# Patient Record
Sex: Female | Born: 1990
Health system: Southern US, Community
[De-identification: ages and names within clinical notes are randomized; demographics above are authoritative.]

## PROBLEM LIST (undated history)

## (undated) ENCOUNTER — Inpatient Hospital Stay (HOSPITAL_COMMUNITY): Payer: Self-pay

## (undated) DIAGNOSIS — R51 Headache: Secondary | ICD-10-CM

## (undated) DIAGNOSIS — R519 Headache, unspecified: Secondary | ICD-10-CM

## (undated) HISTORY — PX: NO PAST SURGERIES: SHX2092

## (undated) HISTORY — DX: Headache, unspecified: R51.9

## (undated) HISTORY — DX: Headache: R51

## (undated) HISTORY — PX: WISDOM TOOTH EXTRACTION: SHX21

---

## 2016-11-22 DIAGNOSIS — J3489 Other specified disorders of nose and nasal sinuses: Secondary | ICD-10-CM | POA: Diagnosis not present

## 2016-11-22 DIAGNOSIS — J029 Acute pharyngitis, unspecified: Secondary | ICD-10-CM | POA: Diagnosis not present

## 2016-11-22 DIAGNOSIS — R51 Headache: Secondary | ICD-10-CM | POA: Diagnosis not present

## 2016-11-22 DIAGNOSIS — J069 Acute upper respiratory infection, unspecified: Secondary | ICD-10-CM | POA: Diagnosis not present

## 2016-11-22 DIAGNOSIS — R590 Localized enlarged lymph nodes: Secondary | ICD-10-CM | POA: Diagnosis not present

## 2016-11-22 DIAGNOSIS — R05 Cough: Secondary | ICD-10-CM | POA: Diagnosis not present

## 2016-11-22 DIAGNOSIS — R0981 Nasal congestion: Secondary | ICD-10-CM | POA: Diagnosis not present

## 2016-11-22 DIAGNOSIS — R112 Nausea with vomiting, unspecified: Secondary | ICD-10-CM | POA: Diagnosis not present

## 2017-01-15 ENCOUNTER — Emergency Department (HOSPITAL_COMMUNITY)
Admission: EM | Admit: 2017-01-15 | Discharge: 2017-01-15 | Disposition: A | Discharge status: Home / Self Care | Payer: BLUE CROSS/BLUE SHIELD | Attending: Emergency Medicine | Admitting: Emergency Medicine

## 2017-01-15 ENCOUNTER — Encounter (HOSPITAL_COMMUNITY): Payer: Self-pay

## 2017-01-15 DIAGNOSIS — N898 Other specified noninflammatory disorders of vagina: Secondary | ICD-10-CM | POA: Diagnosis not present

## 2017-01-15 LAB — WET PREP, GENITAL
Sperm: NONE SEEN
Trich, Wet Prep: NONE SEEN
Yeast Wet Prep HPF POC: NONE SEEN

## 2017-01-15 LAB — RAPID HIV SCREEN (HIV 1/2 AB+AG)
HIV 1/2 Antibodies: NONREACTIVE
HIV-1 P24 Antigen - HIV24: NONREACTIVE

## 2017-01-15 LAB — POC URINE PREG, ED: PREG TEST UR: NEGATIVE

## 2017-01-15 MED ORDER — CEFTRIAXONE SODIUM 250 MG IJ SOLR
250.0000 mg | Freq: Once | INTRAMUSCULAR | Status: AC
  Administered 2017-01-15: 250 mg via INTRAMUSCULAR
  Filled 2017-01-15: qty 250

## 2017-01-15 MED ORDER — METRONIDAZOLE 500 MG PO TABS: 500.0000 mg | ORAL_TABLET | Freq: Two times a day (BID) | ORAL | 0 refills | Status: DC

## 2017-01-15 MED ORDER — AZITHROMYCIN 250 MG PO TABS
1000.0000 mg | ORAL_TABLET | Freq: Once | ORAL | Status: AC
  Administered 2017-01-15: 1000 mg via ORAL
  Filled 2017-01-15: qty 4

## 2017-01-15 MED ORDER — LIDOCAINE HCL (PF) 1 % IJ SOLN
INTRAMUSCULAR | Status: AC
  Administered 2017-01-15: 2 mL
  Filled 2017-01-15: qty 5

## 2017-01-15 NOTE — ED Triage Notes (Signed)
Pt reports abnormal vaginal bleeding and discharge onset 2 days ago. She is requesting to be tested for STDs.

## 2017-01-15 NOTE — ED Provider Notes (Signed)
[facility]EM[facility] DEPT Provider Note   CSN: [id number] Arrival date & time: 01/15/17  1328     History   Chief Complaint Chief Complaint  Patient presents with  . Vaginal Discharge    HPI [name] is a 26 y.o. female.  HPI   [age] year old female presents today with complaints of vaginal discharge.  Patient reports symptoms started approximately 2 days ago.  She notes this discharge is abnormal for her, also had very minor bleeding associated with this.  She reports very minor pain over the mid pelvis, no urinary symptoms.  Patient reports she is sexually active not always using protection.  She denies any fever, nausea vomiting upper abdominal pain or any systemic symptoms.  Patient requesting STD testing.  LMP 01/13/2017.    History reviewed. No pertinent past medical history.  There are no active problems to display for this patient.   History reviewed. No pertinent surgical history.  OB History    No data available       Home Medications    Prior to Admission medications   Medication Sig Start Date End Date Taking? Authorizing Provider  metroNIDAZOLE (FLAGYL) 500 MG tablet Take 1 tablet (500 mg total) by mouth 2 (two) times daily. 01/15/17   Eyvonne MechanicHedges, Catricia Scheerer, PA-C    Family History No family history on file.  Social History Social History  Substance Use Topics  . Smoking status: Never Smoker  . Smokeless tobacco: Never Used  . Alcohol use Yes     Allergies   Patient has no allergy information on record.   Review of Systems Review of Systems  All other systems reviewed and are negative.    Physical Exam Updated Vital Signs BP 113/65   Pulse 67   Temp 98.6 F (37 C) (Oral)   Resp 16   LMP 01/13/2017   SpO2 100%   Physical Exam  Constitutional: She is oriented to person, place, and time. She appears well-developed and well-nourished.  HENT:  Head: Normocephalic and atraumatic.  Eyes: Conjunctivae are normal. Pupils are equal, round, and  reactive to light. Right eye exhibits no discharge. Left eye exhibits no discharge. No scleral icterus.  Neck: Normal range of motion. No JVD present. No tracheal deviation present.  Pulmonary/Chest: Effort normal. No stridor.  Abdominal: Soft. She exhibits no distension and no mass. There is no tenderness. There is no rebound and no guarding. No hernia.  Genitourinary:  Genitourinary Comments: Purulent discharge, no bleeding, loss closed, no signs of trauma, no cervical motion tenderness, no adnexal tenderness  Neurological: She is alert and oriented to person, place, and time. Coordination normal.  Psychiatric: She has a normal mood and affect. Her behavior is normal. Judgment and thought content normal.  Nursing note and vitals reviewed.    ED Treatments / Results  Labs (all labs ordered are listed, but only abnormal results are displayed) Labs Reviewed  WET PREP, GENITAL - Abnormal; Notable for the following:       Result Value   Clue Cells Wet Prep HPF POC PRESENT (*)    WBC, Wet Prep HPF POC MANY (*)    All other components within normal limits  HIV ANTIBODY (ROUTINE TESTING)  RAPID HIV SCREEN (HIV 1/2 AB+AG)  POC URINE PREG, ED  GC/CHLAMYDIA PROBE AMP ([place]    EKG  EKG Interpretation None       Radiology No results found.  Procedures Procedures (including critical care time)  Medications Ordered in ED Medications  cefTRIAXone (ROCEPHIN) injection 250 mg (not administered)  lidocaine (PF) (XYLOCAINE) 1 % injection (not administered)  azithromycin (ZITHROMAX) tablet 1,000 mg (1,000 mg Oral Given 01/15/17 1711)     Initial Impression / Assessment and Plan / ED Course  I have reviewed the triage vital signs and the nursing notes.  Pertinent labs & imaging results that were available during my care of the patient were reviewed by me and considered in my medical decision making (see chart for details).      Final Clinical Impressions(s) / ED  Diagnoses   Final diagnoses:  Vaginal discharge    26 year old female presents today with vaginal discharge.  Her exam is significant for discharge consistent with STD.  Patient will be treated prophylactically for gonorrhea and chlamydia.  She also has clue cells on her wet prep.  She will be treated with metronidazole as an outpatient.  No signs of pelvic inflammatory disease or any significant infection.  She will be given return precautions, she verbalized understanding and agreement to today's plan had no further questions or concerns at time discharge.  Patient instructed to inform all sexual partners of any relevant positive testing, and to instruct him for testing and treatment.  New Prescriptions New Prescriptions   METRONIDAZOLE (FLAGYL) 500 MG TABLET    Take 1 tablet (500 mg total) by mouth 2 (two) times daily.     Eyvonne Mechanic, PA-C 01/15/17 1713    Mancel Bale, MD 01/16/17 [phone]

## 2017-01-15 NOTE — Discharge Instructions (Signed)
Please read attached information. If you experience any new or worsening signs or symptoms please return to the emergency room for evaluation. Please follow-up with your primary care provider or specialist as discussed. Please use medication prescribed only as directed and discontinue taking if you have any concerning signs or symptoms.   °

## 2017-01-16 LAB — HIV ANTIBODY (ROUTINE TESTING W REFLEX): HIV Screen 4th Generation wRfx: NONREACTIVE

## 2017-01-17 LAB — GC/CHLAMYDIA PROBE AMP (~~LOC~~) NOT AT ARMC
Chlamydia: POSITIVE — AB
Neisseria Gonorrhea: NEGATIVE

## 2017-04-17 ENCOUNTER — Emergency Department (HOSPITAL_COMMUNITY)
Admission: EM | Admit: 2017-04-17 | Discharge: 2017-04-17 | Disposition: A | Discharge status: Home / Self Care | Payer: BLUE CROSS/BLUE SHIELD | Attending: Emergency Medicine | Admitting: Emergency Medicine

## 2017-04-17 ENCOUNTER — Encounter (HOSPITAL_COMMUNITY): Payer: Self-pay | Admitting: Emergency Medicine

## 2017-04-17 DIAGNOSIS — R197 Diarrhea, unspecified: Secondary | ICD-10-CM | POA: Insufficient documentation

## 2017-04-17 DIAGNOSIS — E876 Hypokalemia: Secondary | ICD-10-CM | POA: Insufficient documentation

## 2017-04-17 DIAGNOSIS — R1013 Epigastric pain: Secondary | ICD-10-CM | POA: Insufficient documentation

## 2017-04-17 DIAGNOSIS — D72829 Elevated white blood cell count, unspecified: Secondary | ICD-10-CM | POA: Insufficient documentation

## 2017-04-17 DIAGNOSIS — R112 Nausea with vomiting, unspecified: Secondary | ICD-10-CM | POA: Insufficient documentation

## 2017-04-17 DIAGNOSIS — R1011 Right upper quadrant pain: Secondary | ICD-10-CM | POA: Diagnosis not present

## 2017-04-17 LAB — CBC
HEMATOCRIT: 36.3 % (ref 36.0–46.0)
HEMOGLOBIN: 11.6 g/dL — AB (ref 12.0–15.0)
MCH: 28.5 pg (ref 26.0–34.0)
MCHC: 32 g/dL (ref 30.0–36.0)
MCV: 89.2 fL (ref 78.0–100.0)
Platelets: 180 10*3/uL (ref 150–400)
RBC: 4.07 MIL/uL (ref 3.87–5.11)
RDW: 12.6 % (ref 11.5–15.5)
WBC: 13.3 10*3/uL — ABNORMAL HIGH (ref 4.0–10.5)

## 2017-04-17 LAB — URINALYSIS, ROUTINE W REFLEX MICROSCOPIC
BILIRUBIN URINE: NEGATIVE
GLUCOSE, UA: NEGATIVE mg/dL
Hgb urine dipstick: NEGATIVE
KETONES UR: NEGATIVE mg/dL
Leukocytes, UA: NEGATIVE
Nitrite: NEGATIVE
PH: 5 (ref 5.0–8.0)
Protein, ur: NEGATIVE mg/dL
SPECIFIC GRAVITY, URINE: 1.014 (ref 1.005–1.030)

## 2017-04-17 LAB — COMPREHENSIVE METABOLIC PANEL
ALBUMIN: 3.9 g/dL (ref 3.5–5.0)
ALT: 12 U/L — ABNORMAL LOW (ref 14–54)
AST: 19 U/L (ref 15–41)
Alkaline Phosphatase: 49 U/L (ref 38–126)
Anion gap: 9 (ref 5–15)
BILIRUBIN TOTAL: 1 mg/dL (ref 0.3–1.2)
BUN: 7 mg/dL (ref 6–20)
CHLORIDE: 103 mmol/L (ref 101–111)
CO2: 24 mmol/L (ref 22–32)
Calcium: 9 mg/dL (ref 8.9–10.3)
Creatinine, Ser: 0.89 mg/dL (ref 0.44–1.00)
GFR calc Af Amer: >60 mL/min (ref >60)
GFR calc non Af Amer: >60 mL/min (ref >60)
GLUCOSE: 115 mg/dL — AB (ref 65–99)
POTASSIUM: 3 mmol/L — AB (ref 3.5–5.1)
SODIUM: 136 mmol/L (ref 135–145)
Total Protein: 6.4 g/dL — ABNORMAL LOW (ref 6.5–8.1)

## 2017-04-17 LAB — PREGNANCY, URINE: Preg Test, Ur: NEGATIVE

## 2017-04-17 LAB — HCG, QUANTITATIVE, PREGNANCY: hCG, Beta Chain, Quant, S: <1 m[IU]/mL (ref <5)

## 2017-04-17 LAB — LIPASE, BLOOD: LIPASE: 33 U/L (ref 11–51)

## 2017-04-17 MED ORDER — ONDANSETRON 4 MG PO TBDP
4.0000 mg | ORAL_TABLET | Freq: Once | ORAL | Status: AC | PRN
  Administered 2017-04-17: 4 mg via ORAL

## 2017-04-17 MED ORDER — POTASSIUM CHLORIDE CRYS ER 20 MEQ PO TBCR: 20.0000 meq | EXTENDED_RELEASE_TABLET | Freq: Every day | ORAL | 0 refills | Status: DC

## 2017-04-17 MED ORDER — ONDANSETRON HCL 4 MG PO TABS: 4.0000 mg | ORAL_TABLET | Freq: Three times a day (TID) | ORAL | 0 refills | Status: DC | PRN

## 2017-04-17 MED ORDER — ONDANSETRON 4 MG PO TBDP
ORAL_TABLET | ORAL | Status: AC
  Filled 2017-04-17: qty 1

## 2017-04-17 NOTE — ED Provider Notes (Signed)
[facility] Provider Note   CSN: [id number] Arrival date & time: 04/17/17  0149     History   Chief Complaint Chief Complaint  Patient presents with  . Abdominal Pain  . Nausea    HPI   Blood pressure (!) 98/52, pulse 61, temperature 98.5 F (36.9 C), temperature source Oral, resp. rate 16, height  (1.702 m), weight 63 kg (139 lb), last menstrual period 03/28/2017, SpO2 99 %.  [name] is a 26 y.o. female complaining of Gastric discomfort followed by multiple episodes of emesis becoming blood-streaked and nonbloody, non-melanotic diarrhea onset approximately 12 hours ago. She denies fevers, chills, sick contacts, change in urination. She states that after the vomiting she had a severe right upper quadrant pain however this has resolved.   History reviewed. No pertinent past medical history.  There are no active problems to display for this patient.   History reviewed. No pertinent surgical history.  OB History    No data available       Home Medications    Prior to Admission medications   Medication Sig Start Date End Date Taking? Authorizing Provider  ondansetron (ZOFRAN) 4 MG tablet Take 1 tablet (4 mg total) by mouth every 8 (eight) hours as needed for nausea or vomiting. 04/17/17   Deeric Cruise, Joni Reining, PA-C  potassium chloride SA (K-DUR,KLOR-CON) 20 MEQ tablet Take 1 tablet (20 mEq total) by mouth daily. 04/17/17   Skyy Mcknight, Mardella Layman    Family History No family history on file.  Social History Social History  Substance Use Topics  . Smoking status: Never Smoker  . Smokeless tobacco: Never Used  . Alcohol use Yes     Allergies   Patient has no known allergies.   Review of Systems Review of Systems  A complete review of systems was obtained and all systems are negative except as noted in the HPI and PMH.    Physical Exam Updated Vital Signs BP (!) 98/52 (BP Location: Right Arm)   Pulse 61   Temp 98.5 F (36.9 C) (Oral)    Resp 16   Ht  (1.702 m)   Wt 63 kg (139 lb)   LMP 03/28/2017   SpO2 99%   BMI 21.77 kg/m   Physical Exam  Constitutional: She is oriented to person, place, and time. She appears well-developed and well-nourished. No distress.  HENT:  Head: Normocephalic and atraumatic.  Mouth/Throat: Oropharynx is clear and moist.  Eyes: Pupils are equal, round, and reactive to light. Conjunctivae and EOM are normal.  Neck: Normal range of motion.  Cardiovascular: Normal rate, regular rhythm and intact distal pulses.   Pulmonary/Chest: Effort normal and breath sounds normal.  Abdominal: Soft. She exhibits no distension and no mass. There is no tenderness. There is no rebound and no guarding. No hernia.  Musculoskeletal: Normal range of motion.  Neurological: She is alert and oriented to person, place, and time. Cranial nerve deficit: .npmdm.  Skin: Capillary refill takes less than 2 seconds. She is not diaphoretic.  Psychiatric: She has a normal mood and affect.  Nursing note and vitals reviewed.    ED Treatments / Results  Labs (all labs ordered are listed, but only abnormal results are displayed) Labs Reviewed  COMPREHENSIVE METABOLIC PANEL - Abnormal; Notable for the following:       Result Value   Potassium 3.0 (*)    Glucose, Bld 115 (*)    Total Protein 6.4 (*)    ALT 12 (*)  All other components within normal limits  CBC - Abnormal; Notable for the following:    WBC 13.3 (*)    Hemoglobin 11.6 (*)    All other components within normal limits  LIPASE, BLOOD  URINALYSIS, ROUTINE W REFLEX MICROSCOPIC  HCG, QUANTITATIVE, PREGNANCY  PREGNANCY, URINE    EKG  EKG Interpretation None       Radiology No results found.  Procedures Procedures (including critical care time)  Medications Ordered in ED Medications  ondansetron (ZOFRAN-ODT) disintegrating tablet 4 mg (4 mg Oral Given 04/17/17 0159)     Initial Impression / Assessment and Plan / ED Course  I have  reviewed the triage vital signs and the nursing notes.  Pertinent labs & imaging results that were available during my care of the patient were reviewed by me and considered in my medical decision making (see chart for details).     Vitals:   04/17/17 0155 04/17/17 0156 04/17/17 0605  BP: (!) 132/108  (!) 98/52  Pulse: 71  61  Resp: 18  16  Temp: 98.5 F (36.9 C)    TempSrc: Oral    SpO2: 100%  99%  Weight:  63 kg (139 lb)   Height:   (1.702 m)     Medications  ondansetron (ZOFRAN-ODT) disintegrating tablet 4 mg (4 mg Oral Given 04/17/17 0159)    [name] is 26 y.o. female presenting with Nausea vomiting diarrhea and the vomit turned blood-streaked. Initially after the emesis she had a severe right upper quadrant epigastric pain however this is resolved. Abdominal exam is benign, patient is afebrile and well-appearing, mild hypokalemia and leukocytosis. She is tolerating by mouth's. Likely a viral gastroenteritis. Patient will be given a prescription for Zofran and K Dur. Extensive discussion of return precautions patient verbalizes understanding and teach back technique.  Evaluation does not show pathology that would require ongoing emergent intervention or inpatient treatment. Pt is hemodynamically stable and mentating appropriately. Discussed findings and plan with patient/guardian, who agrees with care plan. All questions answered. Return precautions discussed and outpatient follow up given.      Final Clinical Impressions(s) / ED Diagnoses   Final diagnoses:  Nausea vomiting and diarrhea  Epigastric pain    New Prescriptions New Prescriptions   ONDANSETRON (ZOFRAN) 4 MG TABLET    Take 1 tablet (4 mg total) by mouth every 8 (eight) hours as needed for nausea or vomiting.   POTASSIUM CHLORIDE SA (K-DUR,KLOR-CON) 20 MEQ TABLET    Take 1 tablet (20 mEq total) by mouth daily.     [name], [name], Cordelia Poche 04/17/17 7829    Derwood Kaplan, MD 04/17/17 [phone]

## 2017-04-17 NOTE — ED Triage Notes (Signed)
Pt reports epigastric pain that now radiates into her RUQ present since yesterday. Also reports N/V/D. Pt threw up in triage. Zofran ODT given

## 2017-04-17 NOTE — Discharge Instructions (Signed)

## 2017-05-17 DIAGNOSIS — Z3401 Encounter for supervision of normal first pregnancy, first trimester: Secondary | ICD-10-CM | POA: Diagnosis not present

## 2017-05-17 DIAGNOSIS — Z3400 Encounter for supervision of normal first pregnancy, unspecified trimester: Secondary | ICD-10-CM | POA: Diagnosis not present

## 2017-05-17 LAB — OB RESULTS CONSOLE RPR: RPR: NONREACTIVE

## 2017-05-17 LAB — OB RESULTS CONSOLE ABO/RH: RH Type: POSITIVE

## 2017-05-17 LAB — OB RESULTS CONSOLE ANTIBODY SCREEN: ANTIBODY SCREEN: NEGATIVE

## 2017-05-17 LAB — OB RESULTS CONSOLE GC/CHLAMYDIA
Chlamydia: NEGATIVE
Gonorrhea: NEGATIVE

## 2017-05-17 LAB — OB RESULTS CONSOLE HIV ANTIBODY (ROUTINE TESTING): HIV: NONREACTIVE

## 2017-05-17 LAB — OB RESULTS CONSOLE RUBELLA ANTIBODY, IGM: RUBELLA: IMMUNE

## 2017-05-17 LAB — OB RESULTS CONSOLE HEPATITIS B SURFACE ANTIGEN: Hepatitis B Surface Ag: NEGATIVE

## 2017-05-31 ENCOUNTER — Other Ambulatory Visit: Payer: Self-pay | Admitting: Obstetrics and Gynecology

## 2017-05-31 ENCOUNTER — Other Ambulatory Visit (HOSPITAL_COMMUNITY)
Admission: RE | Admit: 2017-05-31 | Discharge: 2017-05-31 | Disposition: A | Discharge status: Home / Self Care | Payer: BLUE CROSS/BLUE SHIELD | Source: Ambulatory Visit | Attending: Obstetrics and Gynecology | Admitting: Obstetrics and Gynecology

## 2017-05-31 DIAGNOSIS — Z124 Encounter for screening for malignant neoplasm of cervix: Secondary | ICD-10-CM | POA: Insufficient documentation

## 2017-05-31 DIAGNOSIS — Z3401 Encounter for supervision of normal first pregnancy, first trimester: Secondary | ICD-10-CM | POA: Diagnosis not present

## 2017-06-03 LAB — CYTOLOGY - PAP
CHLAMYDIA, DNA PROBE: NEGATIVE
Diagnosis: NEGATIVE
NEISSERIA GONORRHEA: NEGATIVE

## 2017-07-01 ENCOUNTER — Inpatient Hospital Stay (HOSPITAL_COMMUNITY)
Admission: AD | Admit: 2017-07-01 | Discharge: 2017-07-02 | Disposition: A | Discharge status: Home / Self Care | Payer: BLUE CROSS/BLUE SHIELD | Source: Ambulatory Visit | Attending: Obstetrics and Gynecology | Admitting: Obstetrics and Gynecology

## 2017-07-01 ENCOUNTER — Encounter (HOSPITAL_COMMUNITY): Payer: Self-pay | Admitting: *Deleted

## 2017-07-01 DIAGNOSIS — Z3A12 12 weeks gestation of pregnancy: Secondary | ICD-10-CM | POA: Diagnosis not present

## 2017-07-01 DIAGNOSIS — Z3A14 14 weeks gestation of pregnancy: Secondary | ICD-10-CM | POA: Diagnosis not present

## 2017-07-01 DIAGNOSIS — O26892 Other specified pregnancy related conditions, second trimester: Secondary | ICD-10-CM

## 2017-07-01 DIAGNOSIS — R109 Unspecified abdominal pain: Secondary | ICD-10-CM | POA: Insufficient documentation

## 2017-07-01 LAB — URINALYSIS, ROUTINE W REFLEX MICROSCOPIC
Bilirubin Urine: NEGATIVE
Glucose, UA: NEGATIVE mg/dL
Hgb urine dipstick: NEGATIVE
Ketones, ur: NEGATIVE mg/dL
Leukocytes, UA: NEGATIVE
Nitrite: NEGATIVE
Protein, ur: NEGATIVE mg/dL
Specific Gravity, Urine: 1.01 (ref 1.005–1.030)
pH: 6.5 (ref 5.0–8.0)

## 2017-07-01 NOTE — MAU Note (Signed)
Chief Complaint: Abdominal Pain   None    SUBJECTIVE HPI: [name] is a 26 y.o. G1P0 at Unknown who presents to Maternity Admissions reporting abdominal pain.  It started today.  Denies bleeding or discharge.  Denies urinary frequency or urgency.  Denies any hx of STDs.  Prenatal hx unremarkable.  Last intercourse yesterday.  Location: lower abdomen Quality: mild cramping Severity: /10 on pain scale Duration: today Modifying factors: Has taken no otc Associated signs and symptoms: none  Past Medical History:  Diagnosis Date  . Medical history non-contributory    OB History  Gravida Para Term Preterm AB Living  1            SAB TAB Ectopic Multiple Live Births               # Outcome Date GA Lbr Len/2nd Weight Sex Delivery Anes PTL Lv  1 Current              Past Surgical History:  Procedure Laterality Date  . WISDOM TOOTH EXTRACTION     Social History   Socioeconomic History  . Marital status: Single    Spouse name: Not on file  . Number of children: Not on file  . Years of education: Not on file  . Highest education level: Not on file  Social Needs  . Financial resource strain: Not on file  . Food insecurity - worry: Not on file  . Food insecurity - inability: Not on file  . Transportation needs - medical: Not on file  . Transportation needs - non-medical: Not on file  Occupational History  . Not on file  Tobacco Use  . Smoking status: Never Smoker  . Smokeless tobacco: Never Used  Substance and Sexual Activity  . Alcohol use: No    Frequency: Never  . Drug use: Yes    Types: Marijuana    Comment: last use 3 months ago  . Sexual activity: Yes    Birth control/protection: None    Comment: last sex Jun 30 2017  Other Topics Concern  . Not on file  Social History Narrative  . Not on file   Family History  Problem Relation Age of Onset  . Hypertension Father   . Diabetes Maternal Grandmother    No current facility-administered medications on  file prior to encounter.    Current Outpatient Medications on File Prior to Encounter  Medication Sig Dispense Refill  . ondansetron (ZOFRAN) 4 MG tablet Take 1 tablet (4 mg total) by mouth every 8 (eight) hours as needed for nausea or vomiting. 10 tablet 0  . potassium chloride SA (K-DUR,KLOR-CON) 20 MEQ tablet Take 1 tablet (20 mEq total) by mouth daily. 3 tablet 0   No Known Allergies  I have reviewed patient's Past Medical Hx, Surgical Hx, Family Hx, Social Hx, medications and allergies.   Review of Systems  All other systems reviewed and are negative.   OBJECTIVE  Constitutional: Well-developed, well-nourished female in no acute distress.  Cardiovascular: normal rate Respiratory: normal rate and effort.  GI: Abd soft, non-tender, gravid appropriate for gestational age. Pos BS x 4 MS: Extremities nontender, no edema, normal ROM Neurologic: Alert and oriented x 4.  GU: Neg CVAT.  SPECULUM EXAM: NEFG, physiologic discharge, no blood noted, cervix clean  BIMANUAL: cervix closed; uterus 12 week size, no adnexal tenderness or masses.  No CMT.  LAB RESULTS Results for orders placed or performed during the hospital encounter of 07/01/17 (from the past 24  hour(s))  Urinalysis, Routine w reflex microscopic     Status: None   Collection Time: 07/01/17 10:00 PM  Result Value Ref Range   Color, Urine YELLOW YELLOW   APPearance CLEAR CLEAR   Specific Gravity, Urine 1.010 1.005 - 1.030   pH 6.5 5.0 - 8.0   Glucose, UA NEGATIVE NEGATIVE mg/dL   Hgb urine dipstick NEGATIVE NEGATIVE   Bilirubin Urine NEGATIVE NEGATIVE   Ketones, ur NEGATIVE NEGATIVE mg/dL   Protein, ur NEGATIVE NEGATIVE mg/dL   Nitrite NEGATIVE NEGATIVE   Leukocytes, UA NEGATIVE NEGATIVE   Wet mount negative clue, negative hyphae, neg trich IMAGING No results found.  MAU COURSE Orders Placed This Encounter  Procedures  . Urinalysis, Routine w reflex microscopic  . Discharge activity:  No Restrictions  . No  sexual activity restrictions  . Discharge patient Discharge disposition: 01-Home or Self Care; Discharge patient date: 07/01/2017   No orders of the defined types were placed in this encounter.   MDM PE and pelvic, ua ASSESSMENT 1. Abdominal pain in pregnancy, second trimester     PLAN Discharge home in stable condition.  Discussed normal discomforts of pregnancy.  Pt desires note for work for [employer]. bleeding precautions Follow-up Information    Gynecology, [facility] up in 2 week(s).   Specialty:  Obstetrics and Gynecology Contact information: [address] [place] [address] [phone]          Allergies as of 07/01/2017   No Known Allergies     Medication List    STOP taking these medications   ondansetron 4 MG tablet Commonly known as:  ZOFRAN   potassium chloride SA 20 MEQ tablet Commonly known as:  K-DUR,KLOR-CON        [name], [name] Jean, CNM 07/01/2017  11:28 PM

## 2017-07-01 NOTE — Discharge Instructions (Signed)

## 2017-07-16 NOTE — L&D Delivery Note (Addendum)
Delivery Note At 4:49 PM a viable female was delivered via Vaginal, Spontaneous (Presentation:left occiput anterior ;  ).  APGAR: 8, 9; weight pending  .   Placenta status: complete 3 vessel   Cord:  with the following complications:  Uterine atony.. Controlled with pitocin and 1000 mcg of cytotec Per rectum .  Cord pH: NA  Anesthesia:  epidrual  Episiotomy: None Lacerations: Periurethral hemostatic  Suture Repair: None Est. Blood Loss (mL):  400mL  Mom to postpartum.. Mom to receive magnesium sulfate 24 hour postpartum due to preeclampsia with severe features .  Baby to Couplet care / Skin to Skin.  Ashkan Chamberland J. 12/30/2017, 5:11 PM

## 2017-08-08 DIAGNOSIS — Z36 Encounter for antenatal screening for chromosomal anomalies: Secondary | ICD-10-CM | POA: Diagnosis not present

## 2017-08-08 DIAGNOSIS — Z3402 Encounter for supervision of normal first pregnancy, second trimester: Secondary | ICD-10-CM | POA: Diagnosis not present

## 2017-08-08 DIAGNOSIS — Z3A19 19 weeks gestation of pregnancy: Secondary | ICD-10-CM | POA: Diagnosis not present

## 2017-08-15 DIAGNOSIS — O283 Abnormal ultrasonic finding on antenatal screening of mother: Secondary | ICD-10-CM | POA: Diagnosis not present

## 2017-09-05 DIAGNOSIS — O350XX0 Maternal care for (suspected) central nervous system malformation in fetus, not applicable or unspecified: Secondary | ICD-10-CM | POA: Diagnosis not present

## 2017-09-05 DIAGNOSIS — Z3A23 23 weeks gestation of pregnancy: Secondary | ICD-10-CM | POA: Diagnosis not present

## 2017-10-04 DIAGNOSIS — Z3402 Encounter for supervision of normal first pregnancy, second trimester: Secondary | ICD-10-CM | POA: Diagnosis not present

## 2017-11-21 DIAGNOSIS — Z23 Encounter for immunization: Secondary | ICD-10-CM | POA: Diagnosis not present

## 2017-11-29 DIAGNOSIS — Z3403 Encounter for supervision of normal first pregnancy, third trimester: Secondary | ICD-10-CM | POA: Diagnosis not present

## 2017-11-29 LAB — OB RESULTS CONSOLE GBS: GBS: NEGATIVE

## 2017-12-23 ENCOUNTER — Telehealth (HOSPITAL_COMMUNITY): Payer: Self-pay | Admitting: *Deleted

## 2017-12-23 ENCOUNTER — Encounter (HOSPITAL_COMMUNITY): Payer: Self-pay | Admitting: *Deleted

## 2017-12-23 NOTE — Telephone Encounter (Signed)
Preadmission screen  

## 2017-12-25 DIAGNOSIS — O26843 Uterine size-date discrepancy, third trimester: Secondary | ICD-10-CM | POA: Diagnosis not present

## 2017-12-29 ENCOUNTER — Inpatient Hospital Stay (HOSPITAL_COMMUNITY)
Admission: AD | Admit: 2017-12-29 | Discharge: 2018-01-01 | DRG: 806 | Disposition: A | Discharge status: Home / Self Care | Payer: BLUE CROSS/BLUE SHIELD | Attending: Obstetrics and Gynecology | Admitting: Obstetrics and Gynecology

## 2017-12-29 ENCOUNTER — Inpatient Hospital Stay (HOSPITAL_COMMUNITY): Payer: BLUE CROSS/BLUE SHIELD | Admitting: Anesthesiology

## 2017-12-29 ENCOUNTER — Other Ambulatory Visit: Payer: Self-pay

## 2017-12-29 ENCOUNTER — Inpatient Hospital Stay (HOSPITAL_COMMUNITY)
Admission: RE | Admit: 2017-12-29 | Discharge: 2017-12-29 | Disposition: A | Discharge status: Home / Self Care | Payer: BLUE CROSS/BLUE SHIELD | Source: Ambulatory Visit | Attending: Obstetrics and Gynecology | Admitting: Obstetrics and Gynecology

## 2017-12-29 ENCOUNTER — Encounter (HOSPITAL_COMMUNITY): Payer: Self-pay

## 2017-12-29 DIAGNOSIS — Z8042 Family history of malignant neoplasm of prostate: Secondary | ICD-10-CM | POA: Diagnosis not present

## 2017-12-29 DIAGNOSIS — Z83511 Family history of glaucoma: Secondary | ICD-10-CM | POA: Diagnosis not present

## 2017-12-29 DIAGNOSIS — O2603 Excessive weight gain in pregnancy, third trimester: Secondary | ICD-10-CM | POA: Diagnosis not present

## 2017-12-29 DIAGNOSIS — O480 Post-term pregnancy: Secondary | ICD-10-CM | POA: Diagnosis not present

## 2017-12-29 DIAGNOSIS — Z833 Family history of diabetes mellitus: Secondary | ICD-10-CM

## 2017-12-29 DIAGNOSIS — Z3403 Encounter for supervision of normal first pregnancy, third trimester: Secondary | ICD-10-CM

## 2017-12-29 DIAGNOSIS — Z23 Encounter for immunization: Secondary | ICD-10-CM | POA: Diagnosis not present

## 2017-12-29 DIAGNOSIS — Z810 Family history of intellectual disabilities: Secondary | ICD-10-CM | POA: Diagnosis not present

## 2017-12-29 DIAGNOSIS — O1414 Severe pre-eclampsia complicating childbirth: Principal | ICD-10-CM | POA: Diagnosis not present

## 2017-12-29 DIAGNOSIS — Z3A40 40 weeks gestation of pregnancy: Secondary | ICD-10-CM

## 2017-12-29 DIAGNOSIS — E876 Hypokalemia: Secondary | ICD-10-CM | POA: Diagnosis not present

## 2017-12-29 DIAGNOSIS — Z8249 Family history of ischemic heart disease and other diseases of the circulatory system: Secondary | ICD-10-CM | POA: Diagnosis not present

## 2017-12-29 LAB — CBC
HCT: 32.4 % — ABNORMAL LOW (ref 36.0–46.0)
Hemoglobin: 10.4 g/dL — ABNORMAL LOW (ref 12.0–15.0)
MCH: 27.8 pg (ref 26.0–34.0)
MCHC: 32.1 g/dL (ref 30.0–36.0)
MCV: 86.6 fL (ref 78.0–100.0)
PLATELETS: 196 10*3/uL (ref 150–400)
RBC: 3.74 MIL/uL — ABNORMAL LOW (ref 3.87–5.11)
RDW: 14.9 % (ref 11.5–15.5)
WBC: 8.8 10*3/uL (ref 4.0–10.5)

## 2017-12-29 LAB — TYPE AND SCREEN
ABO/RH(D): O POS
Antibody Screen: NEGATIVE

## 2017-12-29 LAB — RPR: RPR Ser Ql: NONREACTIVE

## 2017-12-29 LAB — ABO/RH: ABO/RH(D): O POS

## 2017-12-29 MED ORDER — SOD CITRATE-CITRIC ACID 500-334 MG/5ML PO SOLN: 30.0000 mL | ORAL | Status: DC | PRN

## 2017-12-29 MED ORDER — PHENYLEPHRINE 40 MCG/ML (10ML) SYRINGE FOR IV PUSH (FOR BLOOD PRESSURE SUPPORT): 80.0000 ug | PREFILLED_SYRINGE | INTRAVENOUS | Status: DC | PRN

## 2017-12-29 MED ORDER — LIDOCAINE HCL (PF) 1 % IJ SOLN
INTRAMUSCULAR | Status: DC | PRN
  Administered 2017-12-29 (×2): 5 mL via EPIDURAL

## 2017-12-29 MED ORDER — ACETAMINOPHEN 325 MG PO TABS
650.0000 mg | ORAL_TABLET | ORAL | Status: DC | PRN
  Administered 2017-12-30: 650 mg via ORAL
  Filled 2017-12-29: qty 2

## 2017-12-29 MED ORDER — FENTANYL 2.5 MCG/ML BUPIVACAINE 1/10 % EPIDURAL INFUSION (WH - ANES)
14.0000 mL/h | INTRAMUSCULAR | Status: DC | PRN
  Administered 2017-12-29 – 2017-12-30 (×4): 14 mL/h via EPIDURAL
  Filled 2017-12-29 (×4): qty 100

## 2017-12-29 MED ORDER — PHENYLEPHRINE 40 MCG/ML (10ML) SYRINGE FOR IV PUSH (FOR BLOOD PRESSURE SUPPORT)
80.0000 ug | PREFILLED_SYRINGE | INTRAVENOUS | Status: DC | PRN
  Filled 2017-12-29: qty 5

## 2017-12-29 MED ORDER — ZOLPIDEM TARTRATE 5 MG PO TABS: 5.0000 mg | ORAL_TABLET | Freq: Every evening | ORAL | Status: DC | PRN

## 2017-12-29 MED ORDER — EPHEDRINE 5 MG/ML INJ: 10.0000 mg | INTRAVENOUS | Status: DC | PRN

## 2017-12-29 MED ORDER — LACTATED RINGERS IV SOLN
INTRAVENOUS | Status: DC
  Administered 2017-12-29 (×2): via INTRAVENOUS
  Administered 2017-12-29: 125 mL/h via INTRAVENOUS
  Administered 2017-12-29 – 2017-12-30 (×2): via INTRAVENOUS

## 2017-12-29 MED ORDER — EPHEDRINE 5 MG/ML INJ
10.0000 mg | INTRAVENOUS | Status: DC | PRN
  Filled 2017-12-29: qty 2

## 2017-12-29 MED ORDER — PHENYLEPHRINE 40 MCG/ML (10ML) SYRINGE FOR IV PUSH (FOR BLOOD PRESSURE SUPPORT)
80.0000 ug | PREFILLED_SYRINGE | INTRAVENOUS | Status: DC | PRN
  Filled 2017-12-29: qty 10
  Filled 2017-12-29: qty 5
  Filled 2017-12-29: qty 10

## 2017-12-29 MED ORDER — OXYCODONE-ACETAMINOPHEN 5-325 MG PO TABS: 1.0000 | ORAL_TABLET | ORAL | Status: DC | PRN

## 2017-12-29 MED ORDER — LIDOCAINE HCL (PF) 1 % IJ SOLN
30.0000 mL | INTRAMUSCULAR | Status: DC | PRN
  Filled 2017-12-29: qty 30

## 2017-12-29 MED ORDER — HYDROXYZINE HCL 50 MG PO TABS
50.0000 mg | ORAL_TABLET | Freq: Four times a day (QID) | ORAL | Status: DC | PRN
  Filled 2017-12-29: qty 1

## 2017-12-29 MED ORDER — TERBUTALINE SULFATE 1 MG/ML IJ SOLN: 0.2500 mg | Freq: Once | INTRAMUSCULAR | Status: DC | PRN

## 2017-12-29 MED ORDER — LACTATED RINGERS IV SOLN
500.0000 mL | Freq: Once | INTRAVENOUS | Status: AC
  Administered 2017-12-29: 500 mL via INTRAVENOUS

## 2017-12-29 MED ORDER — OXYCODONE-ACETAMINOPHEN 5-325 MG PO TABS: 2.0000 | ORAL_TABLET | ORAL | Status: DC | PRN

## 2017-12-29 MED ORDER — OXYTOCIN 40 UNITS IN LACTATED RINGERS INFUSION - SIMPLE MED
1.0000 m[IU]/min | INTRAVENOUS | Status: DC
  Administered 2017-12-29: 1 m[IU]/min via INTRAVENOUS
  Administered 2017-12-30: 5 m[IU]/min via INTRAVENOUS

## 2017-12-29 MED ORDER — MISOPROSTOL 25 MCG QUARTER TABLET
25.0000 ug | ORAL_TABLET | ORAL | Status: DC | PRN
  Administered 2017-12-29 (×2): 25 ug via VAGINAL
  Filled 2017-12-29 (×4): qty 1

## 2017-12-29 MED ORDER — ONDANSETRON HCL 4 MG/2ML IJ SOLN
4.0000 mg | Freq: Four times a day (QID) | INTRAMUSCULAR | Status: DC | PRN
  Filled 2017-12-29: qty 2

## 2017-12-29 MED ORDER — OXYTOCIN 40 UNITS IN LACTATED RINGERS INFUSION - SIMPLE MED
2.5000 [IU]/h | INTRAVENOUS | Status: DC
  Administered 2017-12-30: 2.5 [IU]/h via INTRAVENOUS
  Filled 2017-12-29 (×2): qty 1000

## 2017-12-29 MED ORDER — LACTATED RINGERS IV SOLN: 500.0000 mL | Freq: Once | INTRAVENOUS | Status: DC

## 2017-12-29 MED ORDER — FLEET ENEMA 7-19 GM/118ML RE ENEM
1.0000 | ENEMA | RECTAL | Status: DC | PRN
  Administered 2017-12-30: 1 via RECTAL
  Filled 2017-12-29: qty 1

## 2017-12-29 MED ORDER — OXYTOCIN BOLUS FROM INFUSION
500.0000 mL | Freq: Once | INTRAVENOUS | Status: AC
  Administered 2017-12-30: 500 mL via INTRAVENOUS

## 2017-12-29 MED ORDER — DIPHENHYDRAMINE HCL 50 MG/ML IJ SOLN: 12.5000 mg | INTRAMUSCULAR | Status: DC | PRN

## 2017-12-29 MED ORDER — FENTANYL CITRATE (PF) 100 MCG/2ML IJ SOLN
50.0000 ug | INTRAMUSCULAR | Status: DC | PRN
  Administered 2017-12-29 (×3): 100 ug via INTRAVENOUS
  Filled 2017-12-29 (×3): qty 2

## 2017-12-29 MED ORDER — LACTATED RINGERS IV SOLN
500.0000 mL | INTRAVENOUS | Status: DC | PRN
  Administered 2017-12-29: 300 mL via INTRAVENOUS
  Administered 2017-12-30: 1000 mL via INTRAVENOUS
  Administered 2017-12-30: 500 mL via INTRAVENOUS
  Administered 2017-12-30: 250 mL via INTRAVENOUS
  Administered 2017-12-30: 1000 mL via INTRAVENOUS

## 2017-12-29 NOTE — Anesthesia Pain Management Evaluation Note (Signed)
  [facility] Pain Management Visit Note  Patient: [name], 27 y.o., female  "Hello I am a [facility] of the anesthesia [facility]. We have an anesthesia team available at all times to provide care throughout the hospital, including epidural management and anesthesia for C-section. I don't know your plan for the delivery whether it a natural birth, water birth, IV sedation, nitrous supplementation, doula or epidural, but we want to meet your pain goals."   1.Was your pain managed to your expectations on prior hospitalizations?   No prior hospitalizations  2.What is your expectation for pain management during this hospitalization?     Epidural  3.How can we help you reach that goal? unsure  Record the patient's initial score and the patient's pain goal.   Pain: 5  Pain Goal: 8 [facility] wants you to be able to say your pain was always managed very well.  Cephus ShellingBURGER,[name] 12/29/2017

## 2017-12-29 NOTE — Progress Notes (Addendum)
[name] is a 27 y.o. G1P0 at 7721w2d   Subjective: Pt with c/o significant pain since SROM.  Pt has declined epidural b/c "I don't want to get it too soon."  IV pain medication has helped some.  Objective: BP (!) 143/87   Pulse (!) 101   Temp 98.1 F (36.7 C) (Oral)   Resp 20   Ht 5' 7.1" (1.704 m)   Wt 86.4 kg (190 lb 6.4 oz)   LMP 03/22/2017   BMI 29.73 kg/m  No intake/output data recorded. No intake/output data recorded.  Pt moaning with contractions. Abd:  Contractions moderate FHT:  FHR: 130s bpm, variability: moderate,  accelerations:  Present,  decelerations:  Absent and   UC:   regular, every 2 minutes SVE:   Dilation: 2.5 Effacement (%): 80 Station: -1 Exam by:: Dion BodyVarnado, MD Tight 3 cm.    Labs: Lab Results  Component Value Date   WBC 8.8 12/29/2017   HGB 10.4 (L) 12/29/2017   HCT 32.4 (L) 12/29/2017   MCV 86.6 12/29/2017   PLT 196 12/29/2017    Assessment / Plan: IUP @ 40 2/7 weeks  Labor: Progressing normally on Pitocin.  Preeclampsia:  BP Normal Fetal Wellbeing:  Category I Pain Control:  Agreeable to epidural now. I/D:  SROM x 4+ hours Anticipated MOD:  NSVD  [name] 12/29/2017, 9:29 PM

## 2017-12-29 NOTE — Anesthesia Preprocedure Evaluation (Addendum)
Anesthesia Evaluation  Patient identified by MRN, date of birth, ID band Patient awake    Reviewed: Allergy & Precautions, NPO status , Patient's Chart, lab work & pertinent test results  History of Anesthesia Complications Negative for: history of anesthetic complications  Airway Mallampati: III  TM Distance: >3 FB Neck ROM: Full    Dental  (+) Dental Advisory Given   Pulmonary neg pulmonary ROS,    breath sounds clear to auscultation       Cardiovascular negative cardio ROS   Rhythm:Regular Rate:Normal     Neuro/Psych  Headaches,    GI/Hepatic Neg liver ROS, GERD  ,  Endo/Other  negative endocrine ROS  Renal/GU negative Renal ROS     Musculoskeletal   Abdominal   Peds  Hematology Hb 10.4, plt 196k   Anesthesia Other Findings   Reproductive/Obstetrics (+) Pregnancy                            Anesthesia Physical Anesthesia Plan  ASA: II  Anesthesia Plan: Epidural   Post-op Pain Management:    Induction:   PONV Risk Score and Plan: 2 and Treatment may vary due to age or medical condition  Airway Management Planned: Natural Airway  Additional Equipment:   Intra-op Plan:   Post-operative Plan:   Informed Consent: I have reviewed the patients History and Physical, chart, labs and discussed the procedure including the risks, benefits and alternatives for the proposed anesthesia with the patient or authorized representative who has indicated his/her understanding and acceptance.   Dental advisory given  Plan Discussed with:   Anesthesia Plan Comments: (Patient identified. Risks/Benefits/Options discussed with patient including but not limited to bleeding, infection, nerve damage, paralysis, failed block, incomplete pain control, headache, blood pressure changes, nausea, vomiting, reactions to medication both or allergic, itching and postpartum back pain. Confirmed with bedside  nurse the patient's most recent platelet count. Confirmed with patient that they are not currently taking any anticoagulation, have any bleeding history or any family history of bleeding disorders. Patient expressed understanding and wished to proceed. All questions were answered. )       Anesthesia Quick Evaluation

## 2017-12-29 NOTE — Progress Notes (Addendum)
Pt feeling some cramping. RN unable to feel presenting part.  BP (!) 144/73 (BP Location: Right Arm)   Pulse 91   Temp 98.9 F (37.2 C) (Oral)   Resp 18   Ht 5' 7.1" (1.704 m)   Wt 86.4 kg (190 lb 6.4 oz)   LMP 03/22/2017   BMI 29.73 kg/m   Gen:  NAD Cervix: soft, posterior, FT, Vertex Vertex by ultrasound.  Cat I tracing Couplets, triplets every 3 minutes  A/P IUP @ 40 2/7 weeks IOL with Cytotec.  If contractions do not space out, start low dose Pitocin.  Place Foley bulb when dilated. Fentanyl,Nitrous oxide prn.

## 2017-12-29 NOTE — H&P (Signed)
[name] is a 27 y.o. female @ 40 2/7 weeks presenting for an elective IOL. Pt without complaints.  Denies contractions. PNC with [facility](Shemia Bevel) has been complicated by:  Bilateral choroid plexus cysts.  Normal Cell free DNA 50+ pound weight gain.  OB History    Gravida  1   Para      Term      Preterm      AB      Living        SAB      TAB      Ectopic      Multiple      Live Births             Past Medical History:  Diagnosis Date  . Headache    Past Surgical History:  Procedure Laterality Date  . NO PAST SURGERIES    . WISDOM TOOTH EXTRACTION     Family History: family history includes Depression in her maternal grandmother and mother; Diabetes in her father, maternal grandmother, and mother; Glaucoma in her father; Hypertension in her father; Mental retardation in her maternal uncle; Prostate cancer in her father. Social History:  reports that she has never smoked. She has never used smokeless tobacco. She reports that she has current or past drug history. Drug: Marijuana. She reports that she does not drink alcohol.     Maternal Diabetes: No Genetic Screening: Normal Maternal Ultrasounds/Referrals: Abnormal:  Findings:   Other:Bilateral choroid plexus cysts Fetal Ultrasounds or other Referrals:  None Maternal Substance Abuse:  No Significant Maternal Medications:  None Significant Maternal Lab Results:  Lab values include: Group B Strep negative Other Comments:  None  Review of Systems  Gastrointestinal: Negative for abdominal pain.   Maternal Medical History:  Contractions: Perceived severity is mild.    Fetal activity: Perceived fetal activity is normal.    Prenatal Complications - Diabetes: none.    Dilation: Fingertip Effacement (%): Thick Station: -3 Exam by:: C Fisher RN Blood pressure 132/76, pulse 93, temperature 97.9 F (36.6 C), temperature source Oral, resp. rate 16, height 5' 7.1" (1.704 m), weight 86.4 kg (190  lb 6.4 oz), last menstrual period 03/22/2017. Maternal Exam:  Uterine Assessment: Contraction frequency is irregular.   Abdomen: Estimated fetal weight is Ultrasound 1 week ago 8 lb 8 oz..   Fetal presentation: vertex  Introitus: Normal vulva. Pelvis: adequate for delivery.   Cervix: not evaluated.   Fetal Exam Fetal Monitor Review: Variability: moderate (6-25 bpm).   Pattern: accelerations present.    Fetal State Assessment: Category I - tracings are normal.     Physical Exam  Constitutional: She is oriented to person, place, and time. She appears well-developed and well-nourished.  HENT:  Head: Normocephalic and atraumatic.  Eyes: EOM are normal.  Neck: Normal range of motion.  Respiratory: Effort normal. No respiratory distress.  Musculoskeletal: Normal range of motion. She exhibits edema. She exhibits no tenderness.  Neurological: She is alert and oriented to person, place, and time.  Skin: Skin is warm and dry.  Psychiatric: She has a normal mood and affect.    Prenatal labs: ABO, Rh: --/--/O POS Performed at [facility], [address] [place], [place] [address]  [phone](06/16 [id number]) Antibody: NEG (06/16 0055) Rubella: Immune (11/02 0000) RPR: Nonreactive (11/02 0000)  HBsAg: Negative (11/02 0000)  HIV: Non-reactive (11/02 0000)  GBS: Negative (05/17 0000)   Assessment/Plan: IUP @ 40 2/7 weeks IOL.  S/p Cytotec x 2.  Plan to  place Foley bulb when at least 1 cm. Cat I tracing. Excessive maternal weight gain.  Borderline macrosomia. Anticipate SVD.  Geryl Rankins 12/29/2017, 7:49 AM

## 2017-12-29 NOTE — Anesthesia Procedure Notes (Signed)
Epidural Patient location during procedure: OB Start time: 12/29/2017 9:37 PM End time: 12/29/2017 10:04 PM  Staffing Anesthesiologist: Jairo BenJackson, Sherrill Mckamie, MD Performed: anesthesiologist   Preanesthetic Checklist Completed: patient identified, surgical consent, pre-op evaluation, timeout performed, IV checked, risks and benefits discussed and monitors and equipment checked  Epidural Patient position: sitting Prep: site prepped and draped and DuraPrep Patient monitoring: blood pressure, continuous pulse ox and heart rate Approach: midline Location: L3-L4 Injection technique: LOR air  Needle:  Needle type: Tuohy  Needle gauge: 17 G Needle length: 9 cm Needle insertion depth: 6 cm Catheter type: closed end flexible Catheter size: 19 Gauge Catheter at skin depth: 11 cm Test dose: negative (1% lidocaine)  Assessment Events: blood not aspirated, injection not painful, no injection resistance, negative IV test and no paresthesia  Additional Notes Pt identified in Labor room.  Monitors applied. Working IV access confirmed. Sterile prep, drape lumbar spine.  1% lido local L 3,4.  #17ga Touhy LOR air at 6 cm L 3,4, cath in easily to 11 cm skin. Test dose OK, cath dosed and infusion begun.  Patient asymptomatic, VSS, no heme aspirated, tolerated well.  Sandford Craze Vaudine Dutan, MDReason for block:procedure for pain

## 2017-12-30 ENCOUNTER — Encounter (HOSPITAL_COMMUNITY): Payer: Self-pay | Admitting: *Deleted

## 2017-12-30 LAB — CBC
HCT: 31.3 % — ABNORMAL LOW (ref 36.0–46.0)
HCT: 32.2 % — ABNORMAL LOW (ref 36.0–46.0)
Hemoglobin: 10.4 g/dL — ABNORMAL LOW (ref 12.0–15.0)
Hemoglobin: 10.5 g/dL — ABNORMAL LOW (ref 12.0–15.0)
MCH: 28.3 pg (ref 26.0–34.0)
MCH: 27.9 pg (ref 26.0–34.0)
MCHC: 33.2 g/dL (ref 30.0–36.0)
MCHC: 32.6 g/dL (ref 30.0–36.0)
MCV: 85.1 fL (ref 78.0–100.0)
MCV: 85.4 fL (ref 78.0–100.0)
PLATELETS: 168 10*3/uL (ref 150–400)
Platelets: 166 10*3/uL (ref 150–400)
RBC: 3.68 MIL/uL — ABNORMAL LOW (ref 3.87–5.11)
RBC: 3.77 MIL/uL — ABNORMAL LOW (ref 3.87–5.11)
RDW: 14.7 % (ref 11.5–15.5)
RDW: 14.9 % (ref 11.5–15.5)
WBC: 14.8 10*3/uL — ABNORMAL HIGH (ref 4.0–10.5)
WBC: 19.6 10*3/uL — AB (ref 4.0–10.5)

## 2017-12-30 LAB — COMPREHENSIVE METABOLIC PANEL
ALT: 12 U/L — ABNORMAL LOW (ref 14–54)
ANION GAP: 11 (ref 5–15)
AST: 23 U/L (ref 15–41)
Albumin: 2.9 g/dL — ABNORMAL LOW (ref 3.5–5.0)
Alkaline Phosphatase: 112 U/L (ref 38–126)
BUN: 8 mg/dL (ref 6–20)
CHLORIDE: 106 mmol/L (ref 101–111)
CO2: 21 mmol/L — AB (ref 22–32)
Calcium: 8.5 mg/dL — ABNORMAL LOW (ref 8.9–10.3)
Creatinine, Ser: 1.15 mg/dL — ABNORMAL HIGH (ref 0.44–1.00)
GFR calc Af Amer: >60 mL/min (ref >60)
GFR calc non Af Amer: >60 mL/min (ref >60)
Glucose, Bld: 97 mg/dL (ref 65–99)
Potassium: 3.3 mmol/L — ABNORMAL LOW (ref 3.5–5.1)
SODIUM: 138 mmol/L (ref 135–145)
Total Bilirubin: 0.8 mg/dL (ref 0.3–1.2)
Total Protein: 6.2 g/dL — ABNORMAL LOW (ref 6.5–8.1)

## 2017-12-30 LAB — PROTEIN / CREATININE RATIO, URINE
Creatinine, Urine: 253 mg/dL
Protein Creatinine Ratio: 0.47 mg/mg{Cre} — ABNORMAL HIGH (ref 0.00–0.15)
Total Protein, Urine: 119 mg/dL

## 2017-12-30 MED ORDER — BENZOCAINE-MENTHOL 20-0.5 % EX AERO: 1.0000 "application " | INHALATION_SPRAY | CUTANEOUS | Status: DC | PRN

## 2017-12-30 MED ORDER — ACETAMINOPHEN 325 MG PO TABS
650.0000 mg | ORAL_TABLET | ORAL | Status: DC | PRN
  Administered 2017-12-30: 650 mg via ORAL
  Filled 2017-12-30: qty 2

## 2017-12-30 MED ORDER — PRENATAL MULTIVITAMIN CH
1.0000 | ORAL_TABLET | Freq: Every day | ORAL | Status: DC
  Administered 2017-12-31 – 2018-01-01 (×2): 1 via ORAL
  Filled 2017-12-30 (×2): qty 1

## 2017-12-30 MED ORDER — HYDRALAZINE HCL 20 MG/ML IJ SOLN: 10.0000 mg | Freq: Once | INTRAMUSCULAR | Status: DC | PRN

## 2017-12-30 MED ORDER — SODIUM CHLORIDE 0.9 % IV SOLN
3.0000 g | Freq: Four times a day (QID) | INTRAVENOUS | Status: DC
  Administered 2017-12-30 (×2): 3 g via INTRAVENOUS
  Filled 2017-12-30 (×4): qty 3

## 2017-12-30 MED ORDER — MAGNESIUM SULFATE 40 G IN LACTATED RINGERS - SIMPLE
2.0000 g/h | INTRAVENOUS | Status: AC
  Administered 2017-12-31: 2 g/h via INTRAVENOUS
  Filled 2017-12-30: qty 40
  Filled 2017-12-30: qty 500

## 2017-12-30 MED ORDER — WITCH HAZEL-GLYCERIN EX PADS: 1.0000 "application " | MEDICATED_PAD | CUTANEOUS | Status: DC | PRN

## 2017-12-30 MED ORDER — MAGNESIUM SULFATE BOLUS VIA INFUSION
4.0000 g | Freq: Once | INTRAVENOUS | Status: AC
  Administered 2017-12-30: 4 g via INTRAVENOUS
  Filled 2017-12-30: qty 500

## 2017-12-30 MED ORDER — DIPHENHYDRAMINE HCL 25 MG PO CAPS: 25.0000 mg | ORAL_CAPSULE | Freq: Four times a day (QID) | ORAL | Status: DC | PRN

## 2017-12-30 MED ORDER — COCONUT OIL OIL: 1.0000 "application " | TOPICAL_OIL | Status: DC | PRN

## 2017-12-30 MED ORDER — SENNOSIDES-DOCUSATE SODIUM 8.6-50 MG PO TABS
2.0000 | ORAL_TABLET | ORAL | Status: DC
  Administered 2017-12-31 (×2): 2 via ORAL
  Filled 2017-12-30 (×2): qty 2

## 2017-12-30 MED ORDER — SIMETHICONE 80 MG PO CHEW: 80.0000 mg | CHEWABLE_TABLET | ORAL | Status: DC | PRN

## 2017-12-30 MED ORDER — SODIUM BICARBONATE 8.4 % IV SOLN
INTRAVENOUS | Status: DC | PRN
  Administered 2017-12-30: 4 mL via EPIDURAL
  Administered 2017-12-30: 5 mL via EPIDURAL

## 2017-12-30 MED ORDER — OXYCODONE HCL 5 MG PO TABS: 10.0000 mg | ORAL_TABLET | ORAL | Status: DC | PRN

## 2017-12-30 MED ORDER — ZOLPIDEM TARTRATE 5 MG PO TABS: 5.0000 mg | ORAL_TABLET | Freq: Every evening | ORAL | Status: DC | PRN

## 2017-12-30 MED ORDER — FERROUS SULFATE 325 (65 FE) MG PO TABS
325.0000 mg | ORAL_TABLET | Freq: Every day | ORAL | Status: DC
  Administered 2017-12-31 – 2018-01-01 (×2): 325 mg via ORAL
  Filled 2017-12-30 (×2): qty 1

## 2017-12-30 MED ORDER — CARBOPROST TROMETHAMINE 250 MCG/ML IM SOLN
INTRAMUSCULAR | Status: AC
  Filled 2017-12-30: qty 1

## 2017-12-30 MED ORDER — OXYCODONE HCL 5 MG PO TABS: 5.0000 mg | ORAL_TABLET | ORAL | Status: DC | PRN

## 2017-12-30 MED ORDER — IBUPROFEN 600 MG PO TABS
600.0000 mg | ORAL_TABLET | Freq: Four times a day (QID) | ORAL | Status: DC
  Administered 2017-12-30 – 2018-01-01 (×8): 600 mg via ORAL
  Filled 2017-12-30 (×8): qty 1

## 2017-12-30 MED ORDER — ONDANSETRON HCL 4 MG/2ML IJ SOLN: 4.0000 mg | INTRAMUSCULAR | Status: DC | PRN

## 2017-12-30 MED ORDER — LABETALOL HCL 5 MG/ML IV SOLN: 20.0000 mg | INTRAVENOUS | Status: DC | PRN

## 2017-12-30 MED ORDER — ONDANSETRON HCL 4 MG PO TABS: 4.0000 mg | ORAL_TABLET | ORAL | Status: DC | PRN

## 2017-12-30 MED ORDER — DIBUCAINE 1 % RE OINT: 1.0000 "application " | TOPICAL_OINTMENT | RECTAL | Status: DC | PRN

## 2017-12-30 MED ORDER — MISOPROSTOL 200 MCG PO TABS
ORAL_TABLET | ORAL | Status: AC
  Administered 2017-12-30: 1000 ug via RECTAL
  Filled 2017-12-30: qty 5

## 2017-12-30 MED ORDER — LIDOCAINE-EPINEPHRINE (PF) 2 %-1:200000 IJ SOLN
INTRAMUSCULAR | Status: DC | PRN
  Administered 2017-12-30: 4 mL via EPIDURAL
  Administered 2017-12-30: 5 mL via EPIDURAL

## 2017-12-30 MED ORDER — LACTATED RINGERS IV SOLN
INTRAVENOUS | Status: DC
  Administered 2017-12-31: 01:00:00 via INTRAVENOUS

## 2017-12-30 NOTE — Progress Notes (Signed)
This note also relates to the following rows which could not be included: SpO2 - Cannot attach notes to unvalidated device data   RN spoke to [place], MD MD aware of fetal deceleration, position change and fluid bolus MD advises if deceleration occurs again, RN to cut pitocin dosage in half and perform SVE

## 2017-12-30 NOTE — Progress Notes (Signed)
In to check pt. Pt comfortable s/p epidural. Cervix 3-4/90/0.  Head well applied. IUPC placed without difficulty after pt counseled. Need to determine MVUs to increase pitocin prn due to intermittent tachysystole. Pitocin 3 mUs. Cat I tracing.

## 2017-12-30 NOTE — Progress Notes (Signed)
Pharmacy Antibiotic Note  [name] is a 27 y.o. G1P0 at 7374w3d admitted on 12/29/2017 for elective IOL. Patient now  SROM x 11 hours with increased temperature and presumed chorioamnionitis. Pharmacy has been consulted for Unasyn dosing.   Plan: Unasyn 3 Gm IV every 6 hours  Height: 5' 7.1" (170.4 cm) Weight: 190 lb 6.4 oz (86.4 kg) IBW/kg (Calculated) : 61.83  Temp (24hrs), Avg:98.7 F (37.1 C), Min:97.4 F (36.3 C), Max:100.7 F (38.2 C)  Recent Labs  Lab 12/29/17 0055  WBC 8.8    CrCl cannot be calculated (Patient's most recent lab result is older than the maximum 21 days allowed.).    No Known Allergies  Antimicrobials this admission:  Dose adjustments this admission: N/A   Thank you for allowing pharmacy to be a part of this patient's care.  Arelia SneddonMason, Belia Febo Anne 12/30/2017 4:18 AM

## 2017-12-30 NOTE — Progress Notes (Addendum)
Pt starting to feel pressure again.  Denies headache or visual changes.  BP 140/77   Pulse (!) 114   Temp 99.6 F (37.6 C) (Oral)   Resp 18   Ht 5' 7.1" (1.704 m)   Wt 86.4 kg (190 lb 6.4 oz)   LMP 03/22/2017   SpO2 96%   BMI 29.73 kg/m   UOP 50 ml q 1 hour x 2 hour  Gen:  NAD, sitting up right. Cervix:  Anterior lip, not completely reducible.  +2 station  Good variability but few accelrations.  Variable while checking then accelerations noted. Contractions q 1-2 minutes   PCR 0.47  IUP @ 40 3/7 weeks Preeclampsia with severe features.  BP normal to mildly elevated.  Strict I/Os  Decrease LR to 75 ml.  Magnesium postpartum Maternal fever, likely chorioamnionitis.  Afebrile currently.  Continue Unasyn q 6 hours. Protracted labor but inadequate MVUs.  Increase Pitocin.  Anticipate SVD. Dr. Richardson Doppole covering until 7 pm.  Pt aware.

## 2017-12-30 NOTE — Progress Notes (Signed)
Pt with c/o urge to have a BM and pressure.   Per RN, cervix 5 cm, 0 station. Rectal exam:  Vertex felt, hard stool behind head. Cat I tracing. Place enema.  Addendum: Baseline increasing. Current Temp 100.7.  SROM x 11 hours. Give Tylenol and start Unasyn.

## 2017-12-30 NOTE — Progress Notes (Signed)
[name] is a 27 y.o. G1P0000 at 1796w3d   Subjective: C/o rectal pressure that has not been relieved.  Pt refuses peanut at this time.  Objective: BP (!) 121/57   Pulse (!) 117   Temp 99.1 F (37.3 C) (Axillary)   Resp 18   Ht 5' 7.1" (1.704 m)   Wt 86.4 kg (190 lb 6.4 oz)   LMP 03/22/2017   SpO2 99%   BMI 29.73 kg/m  No intake/output data recorded. Total I/O In: -  Out: 250 [Urine:250]  FHT:  Good variability, decelerations ~ 4am.  Resolved with decrease of Pitocin UC:   regular, every 2 minutes SVE:  8/80/+1, minimal cervix on the left  Labs: Lab Results  Component Value Date   WBC 14.8 (H) 12/30/2017   HGB 10.4 (L) 12/30/2017   HCT 31.3 (L) 12/30/2017   MCV 85.1 12/30/2017   PLT 168 12/30/2017   CMP     Component Value Date/Time   NA 138 12/30/2017 0747   K 3.3 (L) 12/30/2017 0747   CL 106 12/30/2017 0747   CO2 21 (L) 12/30/2017 0747   GLUCOSE 97 12/30/2017 0747   BUN 8 12/30/2017 0747   CREATININE 1.15 (H) 12/30/2017 0747   CALCIUM 8.5 (L) 12/30/2017 0747   PROT 6.2 (L) 12/30/2017 0747   ALBUMIN 2.9 (L) 12/30/2017 0747   AST 23 12/30/2017 0747   ALT 12 (L) 12/30/2017 0747   ALKPHOS 112 12/30/2017 0747   BILITOT 0.8 12/30/2017 0747   GFRNONAA >60 12/30/2017 0747   GFRAA >60 12/30/2017 0747   Assessment / Plan: IUP @ 40 3/7 weeks  Labor: Progressed despite inadequate MVUs.   Preeclampsia:  Elevated Creatnine.  F/u PCR.  Strict I/Os. Fetal Wellbeing:  Category I currently Pain Control:  Epidural  Pain poorly controlled.  Attempt recurrent bolus at bedside.  Notify anesthesia if no improvement for redosing. I/D:  Maternal fever, SROM x 12 hours.  On Unasyn, afebrile currently. Anticipated MOD:  NSVD  [name] 12/30/2017, 8:27 AM

## 2017-12-31 LAB — CBC
HCT: 30.4 % — ABNORMAL LOW (ref 36.0–46.0)
Hemoglobin: 10 g/dL — ABNORMAL LOW (ref 12.0–15.0)
MCH: 27.8 pg (ref 26.0–34.0)
MCHC: 32.9 g/dL (ref 30.0–36.0)
MCV: 84.4 fL (ref 78.0–100.0)
PLATELETS: 182 10*3/uL (ref 150–400)
RBC: 3.6 MIL/uL — AB (ref 3.87–5.11)
RDW: 15 % (ref 11.5–15.5)
WBC: 21.1 10*3/uL — ABNORMAL HIGH (ref 4.0–10.5)

## 2017-12-31 LAB — COMPREHENSIVE METABOLIC PANEL
ALBUMIN: 2.3 g/dL — AB (ref 3.5–5.0)
ALT: 12 U/L — AB (ref 14–54)
ANION GAP: 9 (ref 5–15)
AST: 26 U/L (ref 15–41)
Alkaline Phosphatase: 92 U/L (ref 38–126)
BUN: 8 mg/dL (ref 6–20)
CALCIUM: 7.9 mg/dL — AB (ref 8.9–10.3)
CHLORIDE: 103 mmol/L (ref 101–111)
CO2: 22 mmol/L (ref 22–32)
Creatinine, Ser: 1.05 mg/dL — ABNORMAL HIGH (ref 0.44–1.00)
GFR calc Af Amer: >60 mL/min (ref >60)
GFR calc non Af Amer: >60 mL/min (ref >60)
GLUCOSE: 103 mg/dL — AB (ref 65–99)
Potassium: 3.3 mmol/L — ABNORMAL LOW (ref 3.5–5.1)
SODIUM: 134 mmol/L — AB (ref 135–145)
Total Bilirubin: 1.1 mg/dL (ref 0.3–1.2)
Total Protein: 5.6 g/dL — ABNORMAL LOW (ref 6.5–8.1)

## 2017-12-31 LAB — LACTATE DEHYDROGENASE: LDH: 277 U/L — ABNORMAL HIGH (ref 98–192)

## 2017-12-31 LAB — MAGNESIUM: Magnesium: 5.4 mg/dL — ABNORMAL HIGH (ref 1.7–2.4)

## 2017-12-31 MED ORDER — POTASSIUM CHLORIDE CRYS ER 20 MEQ PO TBCR
20.0000 meq | EXTENDED_RELEASE_TABLET | Freq: Four times a day (QID) | ORAL | Status: AC
  Administered 2017-12-31 – 2018-01-01 (×4): 20 meq via ORAL
  Filled 2017-12-31 (×4): qty 1

## 2017-12-31 NOTE — Progress Notes (Signed)
CSW acknowledges consult. CSW completed chart review and consult was screened out.  Per MOB's OB records MOB's substance use was prior to MOB's  pregnancy.     Blaine HamperAngel Boyd-Gilyard, MSW, LCSW Clinical Social Work [phone]

## 2017-12-31 NOTE — Lactation Note (Signed)
This note was copied from a baby's chart. Lactation Consultation Note  Patient Name: [name] ZOXWR'UToday's Date: 12/31/2017 Reason for consult: Initial assessment;Term  P1 mother whose infant is now 3010 hours old.    Baby was sleeping in bassinet when I arrived and mother just recently breastfed.  She had no questions/concerns related to breastfeeding.  RN informed me that mother had an abundant supply of colostrum.  Encouraged mother to feed 8-12 times/24 hours or earlier if baby shows feeding cues.  Reviewed feeding cues with mother.  Continue STS and hand expression.  Mother will call for latch assistance as needed.  Father present.  Mom made aware of O/P services, breastfeeding support groups, community resources, and our phone # for post-discharge questions.    Maternal Data Formula Feeding for Exclusion: No Has patient been taught Hand Expression?: Yes Does the patient have breastfeeding experience prior to this delivery?: No  Feeding Feeding Type: Breast Fed Length of feed: 20 min  LATCH Score Latch: Repeated attempts needed to sustain latch, nipple held in mouth throughout feeding, stimulation needed to elicit sucking reflex.  Audible Swallowing: A few with stimulation  Type of Nipple: Everted at rest and after stimulation  Comfort (Breast/Nipple): Soft / non-tender  Hold (Positioning): Assistance needed to correctly position infant at breast and maintain latch.  LATCH Score: 7  Interventions Interventions: Assisted with latch;Skin to skin;Hand express  Lactation Tools Discussed/Used     Consult Status Consult Status: Follow-up Date: 01/01/18 Follow-up type: In-patient    Koralyn Prestage R Argus Caraher 12/31/2017, 3:14 AM

## 2017-12-31 NOTE — Progress Notes (Addendum)
Postpartum day #1, NSVD  Subjective Pt without complaints.  Lochia normal.  Pain controlled.  Breast feeding yes. Pt denies headaches, visual changes.    Temp:  [97.8 F (36.6 C)-101.6 F (38.7 C)] 98.5 F (36.9 C) (06/18 0827) Pulse Rate:  [84-152] 92 (06/18 0827) Resp:  [16-20] 16 (06/18 0827) BP: (115-151)/(46-102) 121/70 (06/18 0827) SpO2:  [96 %-100 %] 98 % (06/18 0827)  Gen:  NAD, A&O x 3 Uterine fundus:  Firm, nontender Lochia normal Abd:  No RUQ pain. Neuro DTR 3+ Ext:  +Edema, no calf tenderness bilaterally  CBC    Component Value Date/Time   WBC 21.1 (H) 12/31/2017 0605   RBC 3.60 (L) 12/31/2017 0605   HGB 10.0 (L) 12/31/2017 0605   HCT 30.4 (L) 12/31/2017 0605   PLT 182 12/31/2017 0605   MCV 84.4 12/31/2017 0605   MCH 27.8 12/31/2017 0605   MCHC 32.9 12/31/2017 0605   RDW 15.0 12/31/2017 0605    CMP     Component Value Date/Time   NA 134 (L) 12/31/2017 0605   K 3.3 (L) 12/31/2017 0605   CL 103 12/31/2017 0605   CO2 22 12/31/2017 0605   GLUCOSE 103 (H) 12/31/2017 0605   BUN 8 12/31/2017 0605   CREATININE 1.05 (H) 12/31/2017 0605   CALCIUM 7.9 (L) 12/31/2017 0605   PROT 5.6 (L) 12/31/2017 0605   ALBUMIN 2.3 (L) 12/31/2017 0605   AST 26 12/31/2017 0605   ALT 12 (L) 12/31/2017 0605   ALKPHOS 92 12/31/2017 0605   BILITOT 1.1 12/31/2017 0605   GFRNONAA >60 12/31/2017 0605   GFRAA >60 12/31/2017 16100605     A/P: S/p SVD doing well. Preeclampsia with severe features by Cr. 1.15.  Labs stable.  CR still 1.  Limit fluids.  Magnesium added on.  +Diuresis.  Continue Magnesium until 5 pm today. Presumed chorioamnionitis.  S/p Unasyn intrapartum.  Currently afebrile. Mild hypokalemia-Replace potassium. Routine postpartum care. Lactation support. Discharge tomorrow pending BP.  Geryl Rankinsvelyn Tatisha Cerino 12/31/2017, 8:52 AM    Addendum-Magnesium 5.4

## 2017-12-31 NOTE — Anesthesia Postprocedure Evaluation (Signed)
Anesthesia Post Note  Patient: Delora Fuelicole Kramlich  Procedure(s) Performed: AN AD HOC LABOR EPIDURAL     Patient location during evaluation: [facility] Anesthesia Type: Epidural Level of consciousness: awake and alert Pain management: pain level controlled Vital Signs Assessment: post-procedure vital signs reviewed and stable Respiratory status: spontaneous breathing, nonlabored ventilation and respiratory function stable Cardiovascular status: stable Postop Assessment: no headache, no backache and epidural receding Anesthetic complications: no    Last Vitals:  Vitals:   12/31/17 0815 12/31/17 0827  BP:  121/70  Pulse: (!) 132 92  Resp:  16  Temp:  36.9 C  SpO2:  98%    Last Pain:  Vitals:   12/31/17 0827  TempSrc: Oral  PainSc:    Pain Goal: Patients Stated Pain Goal: 4 (12/30/17 0730)               Emmaline KluverBREWER,Jabre Heo N

## 2017-12-31 NOTE — Lactation Note (Signed)
This note was copied from a baby's chart. Lactation Consultation Note  Patient Name: [name]'UToday's Date: 12/31/2017 Reason for consult: Follow-up assessment;Term  LC reviewed and up dated doc follow sheets Baby 23 hours old , 1% weight loss LC enter room Dad was doing skin to skin with baby. Mom was receptive  and ask for assistance with latching infant to breast .Infant latched on right breast using the cross cradle hold 16 minutes. 23 hour  Infant with  had 3 wet and 3 soiled diapers. Per mom has double electric breast pump at home. Explained to mom hand expression with feeding before and after feedings . Discussed engorgement prevention and treatment.    Maternal Data    Feeding Feeding Type: Breast Fed Length of feed: 16 min  LATCH Score Latch: Grasps breast easily, tongue down, lips flanged, rhythmical sucking.  Audible Swallowing: Spontaneous and intermittent  Type of Nipple: Everted at rest and after stimulation  Comfort (Breast/Nipple): Soft / non-tender  Hold (Positioning): Assistance needed to correctly position infant at breast and maintain latch.  LATCH Score: 9  Interventions    Lactation Tools Discussed/Used     Consult Status Consult Status: Follow-up Date: 01/01/18 Follow-up type: In-patient    Danelle EarthlyRobin Mckensie Scotti 12/31/2017, 4:14 PM

## 2018-01-01 LAB — COMPREHENSIVE METABOLIC PANEL
ALT: 14 U/L (ref 14–54)
AST: 24 U/L (ref 15–41)
Albumin: 2.4 g/dL — ABNORMAL LOW (ref 3.5–5.0)
Alkaline Phosphatase: 85 U/L (ref 38–126)
Anion gap: 9 (ref 5–15)
BILIRUBIN TOTAL: 0.3 mg/dL (ref 0.3–1.2)
BUN: 13 mg/dL (ref 6–20)
CO2: 22 mmol/L (ref 22–32)
CREATININE: 1.01 mg/dL — AB (ref 0.44–1.00)
Calcium: 8.1 mg/dL — ABNORMAL LOW (ref 8.9–10.3)
Chloride: 106 mmol/L (ref 101–111)
Glucose, Bld: 73 mg/dL (ref 65–99)
Potassium: 4 mmol/L (ref 3.5–5.1)
Sodium: 137 mmol/L (ref 135–145)
TOTAL PROTEIN: 5.7 g/dL — AB (ref 6.5–8.1)

## 2018-01-01 LAB — CBC WITH DIFFERENTIAL/PLATELET
BASOS ABS: 0 10*3/uL (ref 0.0–0.1)
Basophils Relative: 0 %
Eosinophils Absolute: 0.1 10*3/uL (ref 0.0–0.7)
Eosinophils Relative: 1 %
HEMATOCRIT: 28.6 % — AB (ref 36.0–46.0)
Hemoglobin: 9.4 g/dL — ABNORMAL LOW (ref 12.0–15.0)
LYMPHS ABS: 2.5 10*3/uL (ref 0.7–4.0)
LYMPHS PCT: 22 %
MCH: 27.9 pg (ref 26.0–34.0)
MCHC: 32.9 g/dL (ref 30.0–36.0)
MCV: 84.9 fL (ref 78.0–100.0)
MONO ABS: 0.5 10*3/uL (ref 0.1–1.0)
Monocytes Relative: 4 %
NEUTROS ABS: 8.5 10*3/uL — AB (ref 1.7–7.7)
Neutrophils Relative %: 73 %
Platelets: 176 10*3/uL (ref 150–400)
RBC: 3.37 MIL/uL — AB (ref 3.87–5.11)
RDW: 15.1 % (ref 11.5–15.5)
WBC: 11.6 10*3/uL — ABNORMAL HIGH (ref 4.0–10.5)

## 2018-01-01 MED ORDER — IBUPROFEN 600 MG PO TABS: 600.0000 mg | ORAL_TABLET | Freq: Four times a day (QID) | ORAL | 0 refills | Status: AC

## 2018-01-01 MED ORDER — FUROSEMIDE 10 MG/ML IJ SOLN
20.0000 mg | Freq: Once | INTRAMUSCULAR | Status: AC
  Administered 2018-01-01: 20 mg via INTRAVENOUS
  Filled 2018-01-01: qty 2

## 2018-01-01 NOTE — Lactation Note (Signed)
This note was copied from a baby's chart. Lactation Consultation Note  Patient Name: [name] ZOXWR'UToday's Date: 01/01/2018 Reason for consult: Follow-up assessment 43 hours female infant full term As LC entered room mom was working on re-latching infant to breast using football hold , per mom she  had previously feed infant for 10 minutes at 11 am. Mom offering breast again due to  Infant still  exhibiting hunger cues. Infant latched with football hold on right breast.  LC reposition pillow and head align of infant for a  good latch.  LC heard audile swallowing with deep latch Mom feed infant to breast 20 minutes felt comfortable per mom.  Reviewed sore nipple , engorgement prevention and treatment. Per RN due mom labs mom was given laxix times 1/ LC recommended mom replenishes herself with a lot of water.  Mother and baby care book reviewed to know how breastfeeding is going well pages 15-16. Instructed mom on hand pump usage flange 24 given and 27 for when milks come in. Per mom has double electric breastpump at home Mom made aware of O/P services, breastfeeding support groups, community resources, and our phone # for post-discharge questions.  Maternal Data Has patient been taught Hand Expression?: Yes Does the patient have breastfeeding experience prior to this delivery?: No  Feeding Feeding Type: Breast Fed Length of feed: 20 min(Football hold with mutiple swallow with good latch )  LATCH Score Latch: Grasps breast easily, tongue down, lips flanged, rhythmical sucking.  Audible Swallowing: Spontaneous and intermittent  Type of Nipple: Everted at rest and after stimulation  Comfort (Breast/Nipple): Soft / non-tender  Hold (Positioning): Assistance needed to correctly position infant at breast and maintain latch.(LC repostion to obtain debt with latch.)  LATCH Score: 9  Interventions Interventions: Breast feeding basics reviewed;Assisted with latch;Skin to skin;Hand  express;Position options;Breast massage;Breast compression;Hand pump;Support pillows;Expressed milk;Adjust position  Lactation Tools Discussed/Used Tools: Pump;Flanges Flange Size: 24;27;Other (comment)(24 flange is good for today , 27 is when milk comes in.) Breast pump type: Manual Pump Review: Setup, frequency, and cleaning;Milk Storage Initiated by:: Danelle Earthlyobin Tres Grzywacz, IBCLC Date initiated:: 12/25/17   Consult Status Consult Status: Complete Date: 01/01/18    Danelle EarthlyRobin Drema Eddington 01/01/2018, 12:06 PM

## 2018-01-01 NOTE — Discharge Instructions (Signed)
Postpartum Care After Vaginal Delivery °The period of time right after you deliver your newborn is called the postpartum period. °What kind of medical care will I receive? °· You may continue to receive fluids and medicines through an IV tube inserted into one of your veins. °· If an incision was made near your vagina (episiotomy) or if you had some vaginal tearing during delivery, cold compresses may be placed on your episiotomy or your tear. This helps to reduce pain and swelling. °· You may be given a squirt bottle to use when you go to the bathroom. You may use this until you are comfortable wiping as usual. To use the squirt bottle, follow these steps: °? Before you urinate, fill the squirt bottle with warm water. Do not use hot water. °? After you urinate, while you are sitting on the toilet, use the squirt bottle to rinse the area around your urethra and vaginal opening. This rinses away any urine and blood. °? You may do this instead of wiping. As you start healing, you may use the squirt bottle before wiping yourself. Make sure to wipe gently. °? Fill the squirt bottle with clean water every time you use the bathroom. °· You will be given sanitary pads to wear. °How can I expect to feel? °· You may not feel the need to urinate for several hours after delivery. °· You will have some soreness and pain in your abdomen and vagina. °· If you are breastfeeding, you may have uterine contractions every time you breastfeed for up to several weeks postpartum. Uterine contractions help your uterus return to its normal size. °· It is normal to have vaginal bleeding (lochia) after delivery. The amount and appearance of lochia is often similar to a menstrual period in the first week after delivery. It will gradually decrease over the next few weeks to a dry, yellow-brown discharge. For most women, lochia stops completely by 6-8 weeks after delivery. Vaginal bleeding can vary from woman to woman. °· Within the first few  days after delivery, you may have breast engorgement. This is when your breasts feel heavy, full, and uncomfortable. Your breasts may also throb and feel hard, tightly stretched, warm, and tender. After this occurs, you may have milk leaking from your breasts. Your health care provider can help you relieve discomfort due to breast engorgement. Breast engorgement should go away within a few days. °· You may feel more sad or worried than normal due to hormonal changes after delivery. These feelings should not last more than a few days. If these feelings do not go away after several days, speak with your health care provider. °How should I care for myself? °· Tell your health care provider if you have pain or discomfort. °· Drink enough water to keep your urine clear or pale yellow. °· Wash your hands thoroughly with soap and water for at least 20 seconds after changing your sanitary pads, after using the toilet, and before holding or feeding your baby. °· If you are not breastfeeding, avoid touching your breasts a lot. Doing this can make your breasts produce more milk. °· If you become weak or lightheaded, or you feel like you might faint, ask for help before: °? Getting out of bed. °? Showering. °· Change your sanitary pads frequently. Watch for any changes in your flow, such as a sudden increase in volume, a change in color, the passing of large blood clots. If you pass a blood clot from your vagina, save it   to show to your health care provider. Do not flush blood clots down the toilet without having your health care provider look at them.  Make sure that all your vaccinations are up to date. This can help protect you and your baby from getting certain diseases. You may need to have immunizations done before you leave the hospital.  If desired, talk with your health care provider about methods of family planning or birth control (contraception). How can I start bonding with my baby? Spending as much time as  possible with your baby is very important. During this time, you and your baby can get to know each other and develop a bond. Having your baby stay with you in your room (rooming in) can give you time to get to know your baby. Rooming in can also help you become comfortable caring for your baby. Breastfeeding can also help you bond with your baby. How can I plan for returning home with my baby?  Make sure that you have a car seat installed in your vehicle. ? Your car seat should be checked by a certified car seat installer to make sure that it is installed safely. ? Make sure that your baby fits into the car seat safely.  Ask your health care provider any questions you have about caring for yourself or your baby. Make sure that you are able to contact your health care provider with any questions after leaving the hospital. This information is not intended to replace advice given to you by your health care provider. Make sure you discuss any questions you have with your health care provider. Document Released: 04/29/2007 Document Revised: 12/05/2015 Document Reviewed: 06/06/2015 Elsevier Interactive Patient Education  2018 [employer]   Preeclampsia and Eclampsia Preeclampsia is a serious condition that develops only during pregnancy. It is also called toxemia of pregnancy. This condition causes high blood pressure along with other symptoms, such as swelling and headaches. These symptoms may develop as the condition gets worse. Preeclampsia may occur at 20 weeks of pregnancy or later. Diagnosing and treating preeclampsia early is very important. If not treated early, it can cause serious problems for you and your baby. One problem it can lead to is eclampsia, which is a condition that causes muscle jerking or shaking (convulsions or seizures) in the mother. Delivering your baby is the best treatment for preeclampsia or eclampsia. Preeclampsia and eclampsia symptoms usually go away after your baby is  born. What are the causes? The cause of preeclampsia is not known. What increases the risk? The following risk factors make you more likely to develop preeclampsia:  Being pregnant for the first time.  Having had preeclampsia during a past pregnancy.  Having a family history of preeclampsia.  Having high blood pressure.  Being pregnant with twins or triplets.  Being 1435 or older.  Being African-American.  Having kidney disease or diabetes.  Having medical conditions such as lupus or blood diseases.  Being very overweight (obese).  What are the signs or symptoms? The earliest signs of preeclampsia are:  High blood pressure.  Increased protein in your urine. Your health care provider will check for this at every visit before you give birth (prenatal visit).  Other symptoms that may develop as the condition gets worse include:  Severe headaches.  Sudden weight gain.  Swelling of the hands, face, legs, and feet.  Nausea and vomiting.  Vision problems, such as blurred or double vision.  Numbness in the face, arms, legs, and feet.  Urinating less than usual.  Dizziness.  Slurred speech.  Abdominal pain, especially upper abdominal pain.  Convulsions or seizures.  Symptoms generally go away after giving birth. How is this diagnosed? There are no screening tests for preeclampsia. Your health care provider will ask you about symptoms and check for signs of preeclampsia during your prenatal visits. You may also have tests that include:  Urine tests.  Blood tests.  Checking your blood pressure.  Monitoring your babys heart rate.  Ultrasound.  How is this treated? You and your health care provider will determine the treatment approach that is best for you. Treatment may include:  Having more frequent prenatal exams to check for signs of preeclampsia, if you have an increased risk for preeclampsia.  Bed rest.  Reducing how much salt (sodium) you  eat.  Medicine to lower your blood pressure.  Staying in the hospital, if your condition is severe. There, treatment will focus on controlling your blood pressure and the amount of fluids in your body (fluid retention).  You may need to take medicine (magnesium sulfate) to prevent seizures. This medicine may be given as an injection or through an IV tube.  Delivering your baby early, if your condition gets worse. You may have your labor started with medicine (induced), or you may have a cesarean delivery.  Follow these instructions at home: Eating and drinking   Drink enough fluid to keep your urine clear or pale yellow.  Eat a healthy diet that is low in sodium. Do not add salt to your food. Check nutrition labels to see how much sodium a food or beverage contains.  Avoid caffeine. Lifestyle  Do not use any products that contain nicotine or tobacco, such as cigarettes and e-cigarettes. If you need help quitting, ask your health care provider.  Do not use alcohol or drugs.  Avoid stress as much as possible. Rest and get plenty of sleep. General instructions  Take over-the-counter and prescription medicines only as told by your health care provider.  When lying down, lie on your side. This keeps pressure off of your baby.  When sitting or lying down, raise (elevate) your feet. Try putting some pillows underneath your lower legs.  Exercise regularly. Ask your health care provider what kinds of exercise are best for you.  Keep all follow-up and prenatal visits as told by your health care provider. This is important. How is this prevented? To prevent preeclampsia or eclampsia from developing during another pregnancy:  Get proper medical care during pregnancy. Your health care provider may be able to prevent preeclampsia or diagnose and treat it early.  Your health care provider may have you take a low-dose aspirin or a calcium supplement during your next pregnancy.  You may  have tests of your blood pressure and kidney function after giving birth.  Maintain a healthy weight. Ask your health care provider for help managing weight gain during pregnancy.  Work with your health care provider to manage any long-term (chronic) health conditions you have, such as diabetes or kidney problems.  Contact a health care provider if:  You gain more weight than expected.  You have headaches.  You have nausea or vomiting.  You have abdominal pain.  You feel dizzy or light-headed. Get help right away if:  You develop sudden or severe swelling anywhere in your body. This usually happens in the legs.  You gain 5 lbs (2.3 kg) or more during one week.  You have severe: ? Abdominal pain. ?  Headaches. °? Dizziness. °? Vision problems. °? Confusion. °? Nausea or vomiting. °· You have a seizure. °· You have trouble moving any part of your body. °· You develop numbness in any part of your body. °· You have trouble speaking. °· You have any abnormal bleeding. °· You pass out. °This information is not intended to replace advice given to you by your health care provider. Make sure you discuss any questions you have with your health care provider. °Document Released: 06/29/2000 Document Revised: 02/28/2016 Document Reviewed: 02/06/2016 °Elsevier Interactive Patient Education © 2018 Elsevier Inc. ° °

## 2018-01-01 NOTE — Progress Notes (Signed)
D/c home teaching complete   QUESTIONS  answered

## 2018-01-01 NOTE — Discharge Summary (Signed)
OB Discharge Summary     Patient Name: [name] DOB: 1990-08-09 MRN: [id number]  Date of admission: 12/29/2017 Delivering MD: Gerald Leitz   Date of discharge: 01/01/2018  Admitting diagnosis: INDUCTION Intrauterine pregnancy: [redacted]w[redacted]d     Secondary diagnosis:  Active Problems:   Supervision of normal first pregnancy in third trimester  Additional problems: Preeclampsia with severe features     Discharge diagnosis: Term Pregnancy Delivered and Preeclampsia (severe)                                                                                                Post partum procedures:Magnesium sulfate  Augmentation: Pitocin and Cytotec  Complications: None  Hospital course:  Induction of Labor With Vaginal Delivery   27 y.o. yo G1P1001 at [redacted]w[redacted]d was admitted to the hospital 12/29/2017 for induction of labor.  Indication for induction: Postdates.  Patient had an uncomplicated labor course as follows: Membrane Rupture Time/Date: 5:37 PM ,12/29/2017   Intrapartum Procedures: Episiotomy: None [1]                                         Lacerations:  Periurethral [8]  Patient had delivery of a Viable infant.  Information for the patient's newborn:  Bradd Burner [[id number]]  Delivery Method: Vag-Spont   12/30/2017  Details of delivery can be found in separate delivery note.  Patient had a routine postpartum course. Patient is discharged home 01/30/18.  Physical exam  Vitals:   12/31/17 2322 01/01/18 0311 01/01/18 0824 01/01/18 1253  BP: 115/66 115/63 122/75 115/63  Pulse: 70 72 70 76  Resp: 16 16 16 16   Temp: 98 F (36.7 C) 98 F (36.7 C) 98 F (36.7 C) 97.9 F (36.6 C)  TempSrc: Oral Oral Oral Oral  SpO2: 100% 99%  100%  Weight:      Height:       General: alert, cooperative and no distress Lochia: appropriate Uterine Fundus: firm Incision: N/A DVT Evaluation: No evidence of DVT seen on physical exam. Calf/Ankle edema is present Labs: Lab Results   Component Value Date   WBC 11.6 (H) 01/01/2018   HGB 9.4 (L) 01/01/2018   HCT 28.6 (L) 01/01/2018   MCV 84.9 01/01/2018   PLT 176 01/01/2018   CMP Latest Ref Rng & Units 01/01/2018  Glucose 65 - 99 mg/dL 73  BUN 6 - 20 mg/dL 13  Creatinine 7.82 - 9.56 mg/dL 2.13(Y)  Sodium 865 - 784 mmol/L 137  Potassium 3.5 - 5.1 mmol/L 4.0  Chloride 101 - 111 mmol/L 106  CO2 22 - 32 mmol/L 22  Calcium 8.9 - 10.3 mg/dL 8.1(L)  Total Protein 6.5 - 8.1 g/dL 6.9(G)  Total Bilirubin 0.3 - 1.2 mg/dL 0.3  Alkaline Phos 38 - 126 U/L 85  AST 15 - 41 U/L 24  ALT 14 - 54 U/L 14    Discharge instruction: per After Visit Summary and "Baby and Me Booklet".  After visit meds:    Diet: routine diet  Activity: Advance as tolerated. Pelvic rest for 6 weeks.   Outpatient follow up:1 week BP check Follow up Appt:No future appointments. Follow up Visit:No follow-ups on file.  Postpartum contraception: Discussed  Newborn Data: Live born female  Birth Weight: 8 lb 5.2 oz (3775 g) APGAR: 8, 9  Newborn Delivery   Birth date/time:  12/30/2017 16:49:50 Delivery type:  Vaginal, Spontaneous     Baby Feeding: Breast Disposition:home with mother   01/01/2018 Geryl RankinsEvelyn Cloy Cozzens, MD

## 2018-02-07 DIAGNOSIS — Z30013 Encounter for initial prescription of injectable contraceptive: Secondary | ICD-10-CM | POA: Diagnosis not present

## 2018-04-25 DIAGNOSIS — Z3042 Encounter for surveillance of injectable contraceptive: Secondary | ICD-10-CM | POA: Diagnosis not present

## 2018-07-11 DIAGNOSIS — Z3042 Encounter for surveillance of injectable contraceptive: Secondary | ICD-10-CM | POA: Diagnosis not present

## 2018-08-08 DIAGNOSIS — J209 Acute bronchitis, unspecified: Secondary | ICD-10-CM | POA: Diagnosis not present

## 2018-08-08 DIAGNOSIS — J069 Acute upper respiratory infection, unspecified: Secondary | ICD-10-CM | POA: Diagnosis not present

## 2018-09-30 DIAGNOSIS — Z3042 Encounter for surveillance of injectable contraceptive: Secondary | ICD-10-CM | POA: Diagnosis not present

## 2019-01-28 DIAGNOSIS — Z01419 Encounter for gynecological examination (general) (routine) without abnormal findings: Secondary | ICD-10-CM | POA: Diagnosis not present

## 2019-04-08 DIAGNOSIS — Z202 Contact with and (suspected) exposure to infections with a predominantly sexual mode of transmission: Secondary | ICD-10-CM | POA: Diagnosis not present

## 2019-06-30 DIAGNOSIS — Z20828 Contact with and (suspected) exposure to other viral communicable diseases: Secondary | ICD-10-CM | POA: Diagnosis not present

## 2019-06-30 DIAGNOSIS — J029 Acute pharyngitis, unspecified: Secondary | ICD-10-CM | POA: Diagnosis not present

## 2019-06-30 DIAGNOSIS — R0981 Nasal congestion: Secondary | ICD-10-CM | POA: Diagnosis not present

## 2019-06-30 DIAGNOSIS — R05 Cough: Secondary | ICD-10-CM | POA: Diagnosis not present

## 2019-07-27 DIAGNOSIS — R079 Chest pain, unspecified: Secondary | ICD-10-CM | POA: Diagnosis not present

## 2019-07-27 DIAGNOSIS — Z79899 Other long term (current) drug therapy: Secondary | ICD-10-CM | POA: Diagnosis not present

## 2019-07-27 DIAGNOSIS — R0781 Pleurodynia: Secondary | ICD-10-CM | POA: Diagnosis not present

## 2019-07-28 ENCOUNTER — Emergency Department (HOSPITAL_BASED_OUTPATIENT_CLINIC_OR_DEPARTMENT_OTHER): Payer: BC Managed Care – PPO

## 2019-07-28 ENCOUNTER — Other Ambulatory Visit: Payer: Self-pay

## 2019-07-28 ENCOUNTER — Encounter (HOSPITAL_BASED_OUTPATIENT_CLINIC_OR_DEPARTMENT_OTHER): Payer: Self-pay | Admitting: Emergency Medicine

## 2019-07-28 ENCOUNTER — Emergency Department (HOSPITAL_BASED_OUTPATIENT_CLINIC_OR_DEPARTMENT_OTHER)
Admission: EM | Admit: 2019-07-28 | Discharge: 2019-07-28 | Disposition: A | Discharge status: Home / Self Care | Payer: BC Managed Care – PPO | Attending: Emergency Medicine | Admitting: Emergency Medicine

## 2019-07-28 DIAGNOSIS — Y929 Unspecified place or not applicable: Secondary | ICD-10-CM | POA: Diagnosis not present

## 2019-07-28 DIAGNOSIS — Y999 Unspecified external cause status: Secondary | ICD-10-CM | POA: Diagnosis not present

## 2019-07-28 DIAGNOSIS — Y939 Activity, unspecified: Secondary | ICD-10-CM | POA: Insufficient documentation

## 2019-07-28 DIAGNOSIS — Z3202 Encounter for pregnancy test, result negative: Secondary | ICD-10-CM | POA: Insufficient documentation

## 2019-07-28 DIAGNOSIS — Z79899 Other long term (current) drug therapy: Secondary | ICD-10-CM | POA: Diagnosis not present

## 2019-07-28 DIAGNOSIS — S299XXA Unspecified injury of thorax, initial encounter: Secondary | ICD-10-CM | POA: Diagnosis not present

## 2019-07-28 DIAGNOSIS — R109 Unspecified abdominal pain: Secondary | ICD-10-CM | POA: Diagnosis not present

## 2019-07-28 DIAGNOSIS — S29011A Strain of muscle and tendon of front wall of thorax, initial encounter: Secondary | ICD-10-CM

## 2019-07-28 DIAGNOSIS — X58XXXA Exposure to other specified factors, initial encounter: Secondary | ICD-10-CM | POA: Insufficient documentation

## 2019-07-28 LAB — BASIC METABOLIC PANEL
Anion gap: 10 (ref 5–15)
BUN: 8 mg/dL (ref 6–20)
CO2: 25 mmol/L (ref 22–32)
Calcium: 9.3 mg/dL (ref 8.9–10.3)
Chloride: 100 mmol/L (ref 98–111)
Creatinine, Ser: 0.81 mg/dL (ref 0.44–1.00)
GFR calc Af Amer: >60 mL/min (ref >60)
GFR calc non Af Amer: >60 mL/min (ref >60)
Glucose, Bld: 95 mg/dL (ref 70–99)
Potassium: 3.5 mmol/L (ref 3.5–5.1)
Sodium: 135 mmol/L (ref 135–145)

## 2019-07-28 LAB — CBC WITH DIFFERENTIAL/PLATELET
Abs Immature Granulocytes: 0.01 10*3/uL (ref 0.00–0.07)
Basophils Absolute: 0 10*3/uL (ref 0.0–0.1)
Basophils Relative: 0 %
Eosinophils Absolute: 0.1 10*3/uL (ref 0.0–0.5)
Eosinophils Relative: 1 %
HCT: 43.2 % (ref 36.0–46.0)
Hemoglobin: 13.6 g/dL (ref 12.0–15.0)
Immature Granulocytes: 0 %
Lymphocytes Relative: 25 %
Lymphs Abs: 2.3 10*3/uL (ref 0.7–4.0)
MCH: 29.1 pg (ref 26.0–34.0)
MCHC: 31.5 g/dL (ref 30.0–36.0)
MCV: 92.5 fL (ref 80.0–100.0)
Monocytes Absolute: 0.6 10*3/uL (ref 0.1–1.0)
Monocytes Relative: 7 %
Neutro Abs: 6.2 10*3/uL (ref 1.7–7.7)
Neutrophils Relative %: 67 %
Platelets: 270 10*3/uL (ref 150–400)
RBC: 4.67 MIL/uL (ref 3.87–5.11)
RDW: 12.6 % (ref 11.5–15.5)
WBC: 9.2 10*3/uL (ref 4.0–10.5)
nRBC: 0 % (ref 0.0–0.2)

## 2019-07-28 LAB — PREGNANCY, URINE: Preg Test, Ur: NEGATIVE

## 2019-07-28 MED ORDER — IBUPROFEN 800 MG PO TABS
800.0000 mg | ORAL_TABLET | Freq: Once | ORAL | Status: AC
  Administered 2019-07-28: 800 mg via ORAL
  Filled 2019-07-28: qty 1

## 2019-07-28 MED ORDER — IOHEXOL 350 MG/ML SOLN
75.0000 mL | Freq: Once | INTRAVENOUS | Status: AC | PRN
  Administered 2019-07-28: 75 mL via INTRAVENOUS

## 2019-07-28 MED ORDER — ACETAMINOPHEN 500 MG PO TABS
1000.0000 mg | ORAL_TABLET | Freq: Once | ORAL | Status: AC
  Administered 2019-07-28: 1000 mg via ORAL
  Filled 2019-07-28: qty 2

## 2019-07-28 NOTE — ED Triage Notes (Signed)
Patient arrived via POV c/o right flank pain starting starting Saturday. Patient recently seen at [facility] for same. Patient is AO x 4, VS WDL, normal gait.

## 2019-07-28 NOTE — ED Provider Notes (Signed)
[facility] EMERGENCY DEPARTMENT Provider Note   CSN: [id number] Arrival date & time: 07/28/19  0437     History Chief Complaint  Patient presents with  . Flank Pain    [name] is a 29 y.o. female.  The history is provided by the patient.  Illness Location:  Right lower anterior and lateral ribs Quality:  Pain with inspiration Severity:  Moderate Onset quality:  Gradual Timing:  Constant Progression:  Unchanged Chronicity:  New Context:  Denies trauma or heavy lifting Relieved by:  Nothing Worsened by:  Movement and breathing Ineffective treatments:  Patient has not taken or tried anything for pain  Associated symptoms: no abdominal pain, no congestion, no cough, no diarrhea, no ear pain, no fatigue, no fever, no headaches, no loss of consciousness, no myalgias, no nausea, no rash, no rhinorrhea, no shortness of breath, no sore throat, no vomiting and no wheezing   Risk factors:  On ocp      Past Medical History:  Diagnosis Date  . Headache     Patient Active Problem List   Diagnosis Date Noted  . Supervision of normal first pregnancy in third trimester 12/29/2017    Past Surgical History:  Procedure Laterality Date  . NO PAST SURGERIES    . WISDOM TOOTH EXTRACTION       OB History    Gravida  1   Para  1   Term  1   Preterm  0   AB  0   Living  1     SAB  0   TAB  0   Ectopic  0   Multiple  0   Live Births  1           Family History  Problem Relation Age of Onset  . Hypertension Father   . Glaucoma Father   . Diabetes Father   . Prostate cancer Father   . Diabetes Maternal Grandmother   . Depression Maternal Grandmother   . Diabetes Mother   . Depression Mother   . Mental retardation Maternal Uncle     Social History   Tobacco Use  . Smoking status: Never Smoker  . Smokeless tobacco: Never Used  Substance Use Topics  . Alcohol use: No  . Drug use: Not Currently    Types: Marijuana    Comment: last  use 3 months ago    Home Medications Prior to Admission medications   Medication Sig Start Date End Date Taking? Authorizing Provider  ferrous sulfate 325 (65 FE) MG tablet Take 325 mg by mouth daily with breakfast.    [provider]  ibuprofen (ADVIL,MOTRIN) 600 MG tablet Take 1 tablet (600 mg total) by mouth every 6 (six) hours. 01/01/18   Thurnell Lose, MD  Prenatal Vit-Fe Fumarate-FA (PRENATAL MULTIVITAMIN) TABS tablet Take 1 tablet by mouth daily at 12 noon.    [provider]    Allergies    Patient has no known allergies.  Review of Systems   Review of Systems  Constitutional: Negative for fatigue and fever.  HENT: Negative for congestion, ear pain, rhinorrhea and sore throat.   Eyes: Negative for visual disturbance.  Respiratory: Negative for cough, shortness of breath and wheezing.   Gastrointestinal: Negative for abdominal pain, diarrhea, nausea and vomiting.  Genitourinary: Negative for difficulty urinating.  Musculoskeletal: Negative for myalgias.  Skin: Negative for rash.  Neurological: Negative for loss of consciousness and headaches.  Psychiatric/Behavioral: Negative for agitation.  All other systems reviewed  and are negative.   Physical Exam Updated Vital Signs BP (!) 116/46 (BP Location: Right Arm)   Pulse 73   Temp 98.6 F (37 C) (Oral)   Resp 16   LMP 07/25/2019 (Exact Date)   SpO2 100%   Breastfeeding No   Physical Exam Vitals and nursing note reviewed.  Constitutional:      General: She is not in acute distress.    Appearance: Normal appearance.  HENT:     Head: Normocephalic and atraumatic.     Nose: Nose normal.  Eyes:     Conjunctiva/sclera: Conjunctivae normal.     Pupils: Pupils are equal, round, and reactive to light.  Cardiovascular:     Rate and Rhythm: Normal rate and regular rhythm.     Pulses: Normal pulses.     Heart sounds: Normal heart sounds.  Pulmonary:     Effort: Pulmonary effort is normal.     Breath  sounds: Normal breath sounds.  Abdominal:     General: Abdomen is flat. Bowel sounds are normal.  Musculoskeletal:        General: Normal range of motion.     Cervical back: Normal range of motion and neck supple.     Right lower leg: No edema.     Left lower leg: No edema.  Skin:    General: Skin is warm and dry.     Capillary Refill: Capillary refill takes less than 2 seconds.  Neurological:     General: No focal deficit present.     Mental Status: She is alert and oriented to person, place, and time.     Deep Tendon Reflexes: Reflexes normal.  Psychiatric:        Thought Content: Thought content normal.     ED Results / Procedures / Treatments   Labs (all labs ordered are listed, but only abnormal results are displayed) Results for orders placed or performed during the hospital encounter of 07/28/19  CBC with Differential  Result Value Ref Range   WBC 9.2 4.0 - 10.5 K/uL   RBC 4.67 3.87 - 5.11 MIL/uL   Hemoglobin 13.6 12.0 - 15.0 g/dL   HCT 10.2 72.5 - 36.6 %   MCV 92.5 80.0 - 100.0 fL   MCH 29.1 26.0 - 34.0 pg   MCHC 31.5 30.0 - 36.0 g/dL   RDW 44.0 34.7 - 42.5 %   Platelets 270 150 - 400 K/uL   nRBC 0.0 0.0 - 0.2 %   Neutrophils Relative % 67 %   Neutro Abs 6.2 1.7 - 7.7 K/uL   Lymphocytes Relative 25 %   Lymphs Abs 2.3 0.7 - 4.0 K/uL   Monocytes Relative 7 %   Monocytes Absolute 0.6 0.1 - 1.0 K/uL   Eosinophils Relative 1 %   Eosinophils Absolute 0.1 0.0 - 0.5 K/uL   Basophils Relative 0 %   Basophils Absolute 0.0 0.0 - 0.1 K/uL   Immature Granulocytes 0 %   Abs Immature Granulocytes 0.01 0.00 - 0.07 K/uL  Basic metabolic panel  Result Value Ref Range   Sodium 135 135 - 145 mmol/L   Potassium 3.5 3.5 - 5.1 mmol/L   Chloride 100 98 - 111 mmol/L   CO2 25 22 - 32 mmol/L   Glucose, Bld 95 70 - 99 mg/dL   BUN 8 6 - 20 mg/dL   Creatinine, Ser 9.56 0.44 - 1.00 mg/dL   Calcium 9.3 8.9 - 38.7 mg/dL   GFR calc non Af Amer >60 >60  mL/min   GFR calc Af Amer >60  >60 mL/min   Anion gap 10 5 - 15  Pregnancy, urine  Result Value Ref Range   Preg Test, Ur NEGATIVE NEGATIVE   CT Angio Chest PE W and/or Wo Contrast  Result Date: 07/28/2019 CLINICAL DATA:  Right flank pain beginning on Saturday EXAM: CT ANGIOGRAPHY CHEST WITH CONTRAST TECHNIQUE: Multidetector CT imaging of the chest was performed using the standard protocol during bolus administration of intravenous contrast. Multiplanar CT image reconstructions and MIPs were obtained to evaluate the vascular anatomy. CONTRAST:  76mL OMNIPAQUE IOHEXOL 350 MG/ML SOLN COMPARISON:  None. FINDINGS: Cardiovascular: Normal heart size. No pericardial effusion. No pulmonary artery filling defect or aortic abnormality. Mediastinum/Nodes: No adenopathy, inflammation, or pneumomediastinum. Lungs/Pleura: Central airways are clear. There is no edema, consolidation, effusion, or pneumothorax. Upper Abdomen: Negative. Visualized portions of the right kidney are negative for visible pyelonephritis or perinephric stranding. Musculoskeletal: Negative Review of the MIP images confirms the above findings. IMPRESSION: Negative chest CTA. Electronically Signed   By: Marnee Spring M.D.   On: 07/28/2019 05:41    Procedures Procedures (including critical care time)  Medications Ordered in ED Medications  ibuprofen (ADVIL) tablet 800 mg (has no administration in time range)  acetaminophen (TYLENOL) tablet 1,000 mg (has no administration in time range)    ED Course  I have reviewed the triage vital signs and the nursing notes.  Pertinent labs & imaging results that were available during my care of the patient were reviewed by me and considered in my medical decision making (see chart for details).    Take tylenol and ibuprofen at home for pain.  Use heat or warm compresses.   [name] was evaluated in Emergency Department on 07/28/2019 for the symptoms described in the history of present illness. She was evaluated in the  context of the global COVID-19 pandemic, which necessitated consideration that the patient might be at risk for infection with the SARS-CoV-2 virus that causes COVID-19. Institutional protocols and algorithms that pertain to the evaluation of patients at risk for COVID-19 are in a state of rapid change based on information released by regulatory bodies including the CDC and federal and state organizations. These policies and algorithms were followed during the patient's care in the ED.  Final Clinical Impression(s) / ED Diagnoses Return for intractable cough, coughing up blood,fevers >100.4 unrelieved by medication, shortness of breath, intractable vomiting, chest pain, shortness of breath, weakness,numbness, changes in speech, facial asymmetry,abdominal pain, passing out,Inability to tolerate liquids or food, cough, altered mental status or any concerns. No signs of systemic illness or infection. The patient is nontoxic-appearing on exam and vital signs are within normal limits.   I have reviewed the triage vital signs and the nursing notes. Pertinent labs &imaging results that were available during my care of the patient were reviewed by me and considered in my medical decision making (see chart for details).  After history, exam, and medical workup I feel the patient has been appropriately medically screened and is safe for discharge home. Pertinent diagnoses were discussed with the patient. Patient was given return   Shayne Deerman, MD 07/28/19 [phone]

## 2020-01-19 DIAGNOSIS — Z23 Encounter for immunization: Secondary | ICD-10-CM | POA: Diagnosis not present

## 2020-02-09 DIAGNOSIS — Z23 Encounter for immunization: Secondary | ICD-10-CM | POA: Diagnosis not present

## 2020-03-06 DIAGNOSIS — J069 Acute upper respiratory infection, unspecified: Secondary | ICD-10-CM | POA: Diagnosis not present

## 2020-03-06 DIAGNOSIS — Z20822 Contact with and (suspected) exposure to covid-19: Secondary | ICD-10-CM | POA: Diagnosis not present

## 2021-05-24 IMAGING — CT CT ANGIO CHEST
2 of 8 series · 19 of 36 positions shown · IV contrast (Omnipaque)
Comparison: None.

CLINICAL DATA: Right flank pain beginning on [REDACTED]

EXAM:
CT ANGIOGRAPHY CHEST WITH CONTRAST
TECHNIQUE: Multidetector CT imaging of the chest was performed using the
standard protocol during bolus administration of intravenous
contrast. Multiplanar CT image reconstructions and MIPs were
obtained to evaluate the vascular anatomy.
CONTRAST:  75mL OMNIPAQUE IOHEXOL 350 MG/ML SOLN

[Series 6: pe coronal mpr · coronal · 0.59mm/px · 1 of 106 slices shown]
[im 53/106  mediastinal]
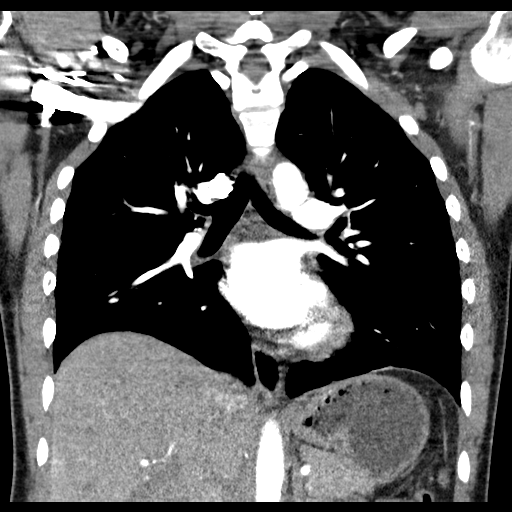

[Series 10: pe thins · axial · 0.64mm/px · z∈[+897,+1153]mm · 18 of 288 slices shown]
[im 16/288  lung]
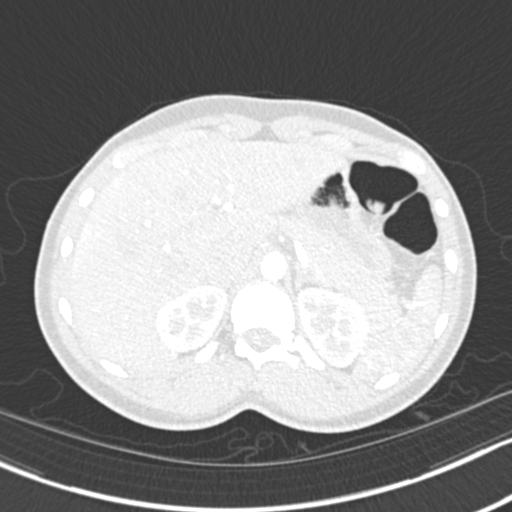
[im 31/288  mediastinal]
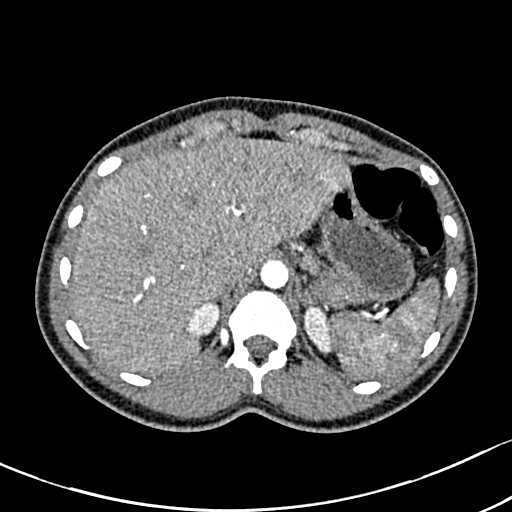
[im 46/288  lung]
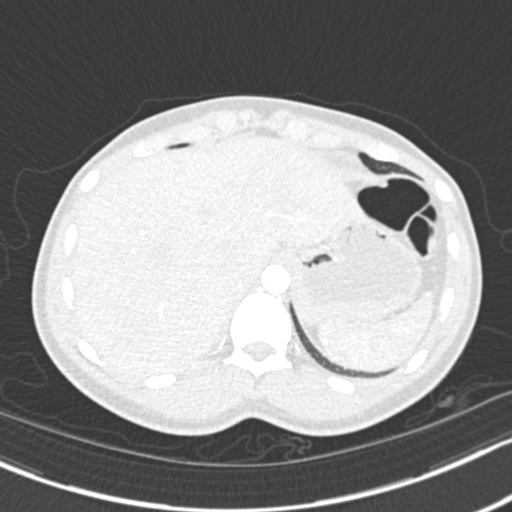
[im 61/288  mediastinal]
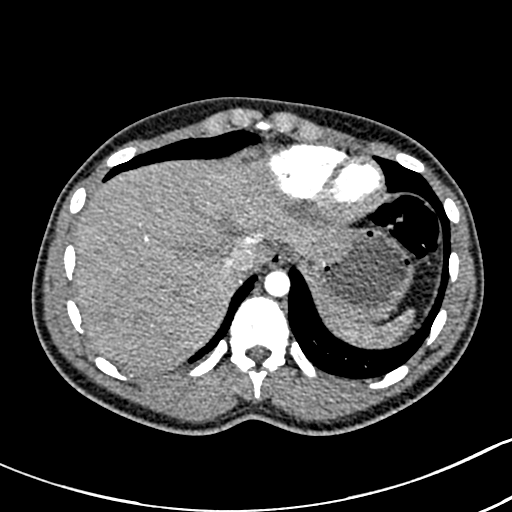
[im 76/288  lung]
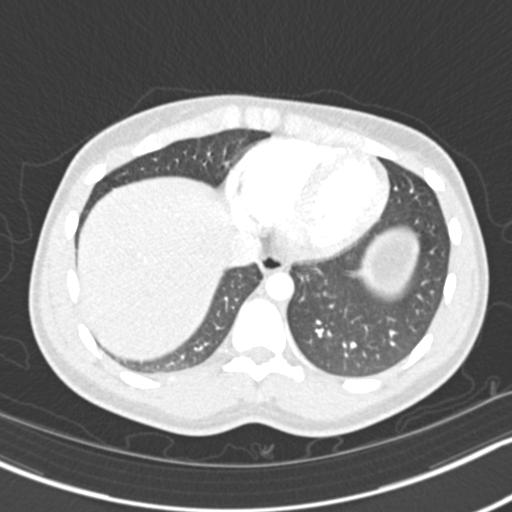
[im 91/288  mediastinal]
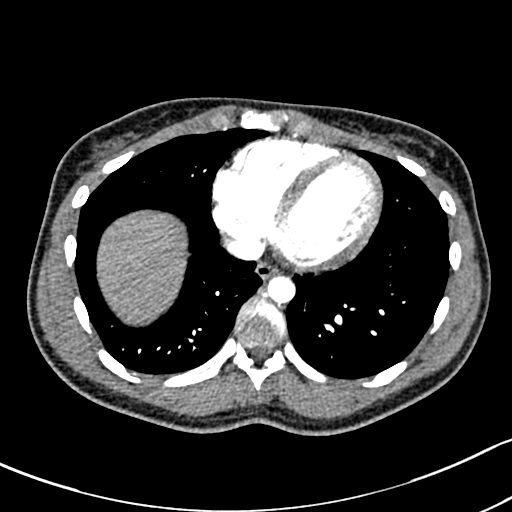
[im 106/288  lung]
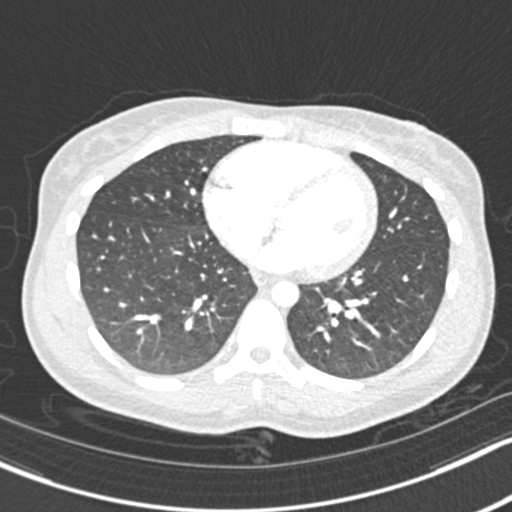
[im 121/288  mediastinal]
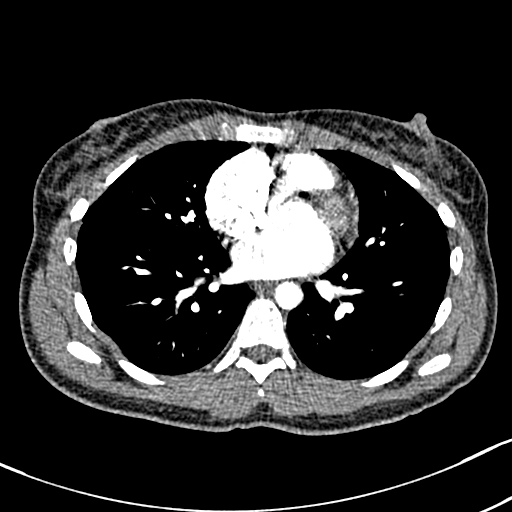
[im 136/288  lung]
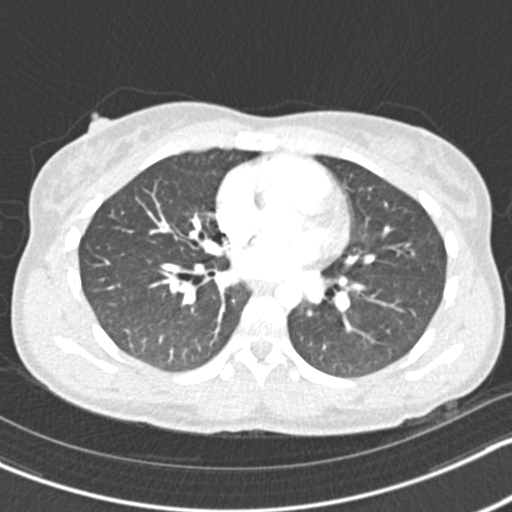
[im 152/288  mediastinal]
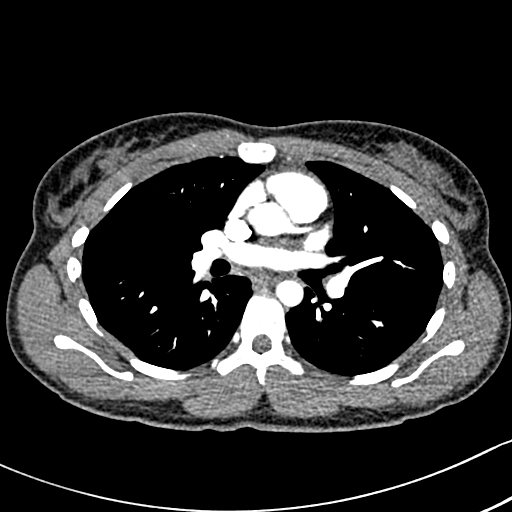
[im 167/288  lung]
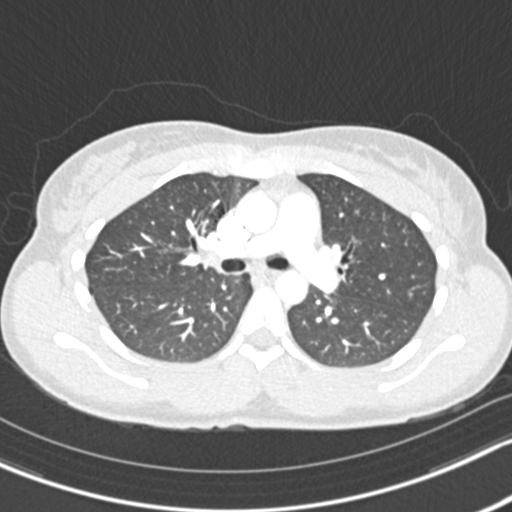
[im 182/288  mediastinal]
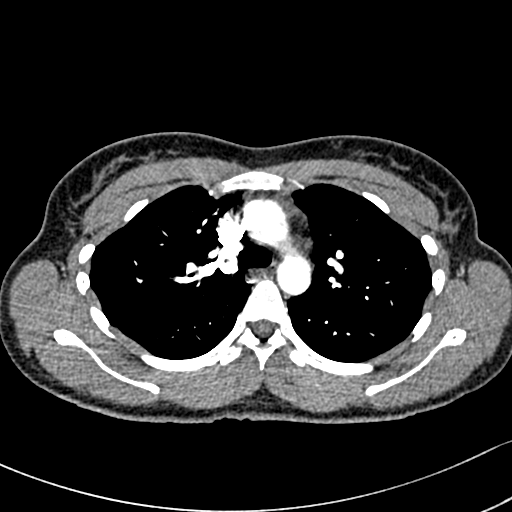
[im 197/288  lung]
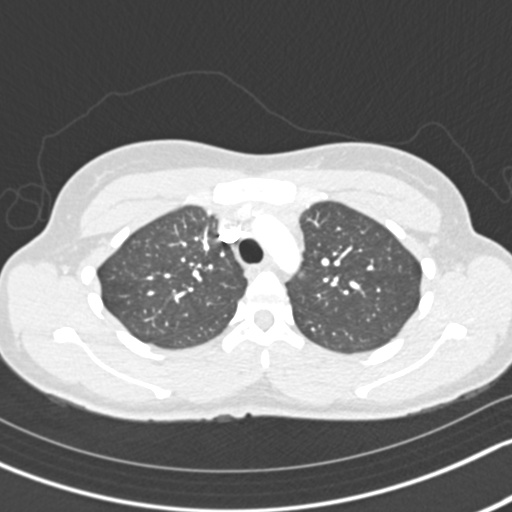
[im 212/288  mediastinal]
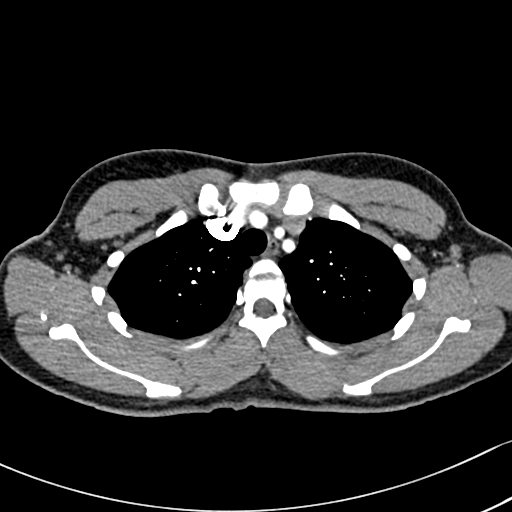
[im 227/288  lung]
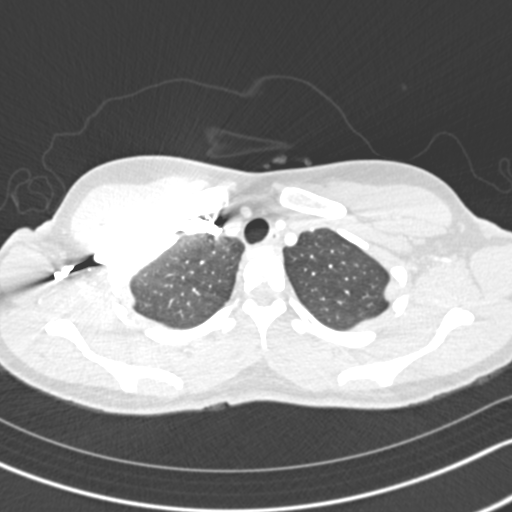
[im 242/288  mediastinal]
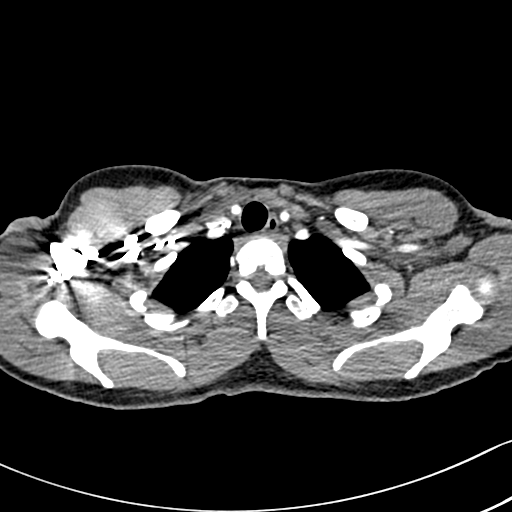
[im 257/288  lung]
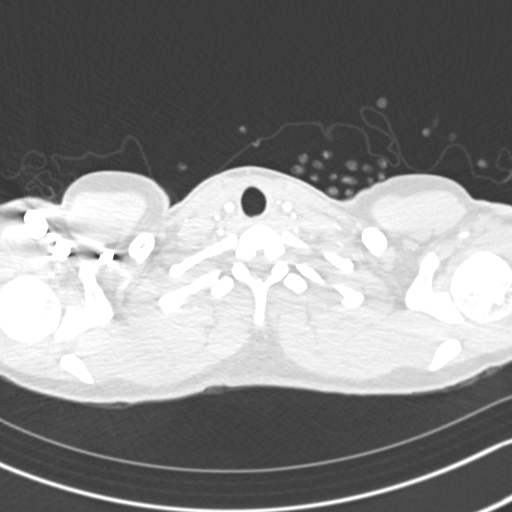
[im 272/288  mediastinal]
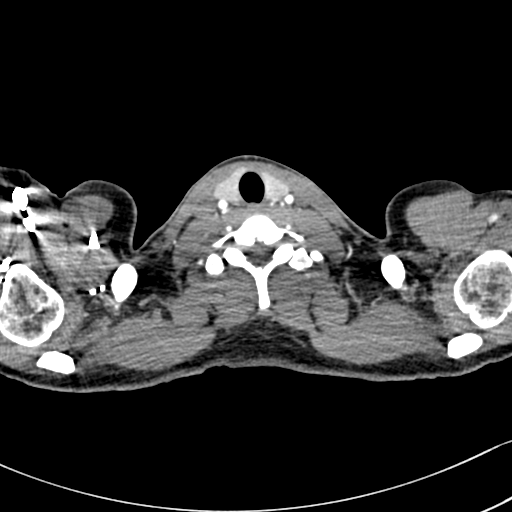

[19 of 36 positions shown; findings below may reference images not displayed]

FINDINGS: Cardiovascular: Normal heart size. No pericardial effusion. No
pulmonary artery filling defect or aortic abnormality.

Mediastinum/Nodes: No adenopathy, inflammation, or
pneumomediastinum.

Lungs/Pleura: Central airways are clear. There is no edema,
consolidation, effusion, or pneumothorax.

Upper Abdomen: Negative. Visualized portions of the right kidney are
negative for visible pyelonephritis or perinephric stranding.

Musculoskeletal: Negative

Review of the MIP images confirms the above findings.
IMPRESSION: Negative chest CTA.
